# Patient Record
Sex: Female | Born: 1975 | Race: Black or African American | Hispanic: No | Marital: Single | Smoking: Current every day smoker
Health system: Southern US, Community
[De-identification: ages and names within clinical notes are randomized; demographics above are authoritative.]

## PROBLEM LIST (undated history)

## (undated) ENCOUNTER — Inpatient Hospital Stay (HOSPITAL_COMMUNITY): Payer: Self-pay

## (undated) DIAGNOSIS — N926 Irregular menstruation, unspecified: Secondary | ICD-10-CM

## (undated) DIAGNOSIS — T148XXA Other injury of unspecified body region, initial encounter: Secondary | ICD-10-CM

## (undated) DIAGNOSIS — F445 Conversion disorder with seizures or convulsions: Secondary | ICD-10-CM

## (undated) DIAGNOSIS — R569 Unspecified convulsions: Secondary | ICD-10-CM

## (undated) DIAGNOSIS — S42001A Fracture of unspecified part of right clavicle, initial encounter for closed fracture: Secondary | ICD-10-CM

## (undated) DIAGNOSIS — G43909 Migraine, unspecified, not intractable, without status migrainosus: Secondary | ICD-10-CM

## (undated) DIAGNOSIS — T4145XA Adverse effect of unspecified anesthetic, initial encounter: Secondary | ICD-10-CM

## (undated) DIAGNOSIS — Z862 Personal history of diseases of the blood and blood-forming organs and certain disorders involving the immune mechanism: Secondary | ICD-10-CM

## (undated) HISTORY — PX: CLAVICLE SURGERY: SHX598

## (undated) HISTORY — PX: WISDOM TOOTH EXTRACTION: SHX21

---

## 1998-03-11 ENCOUNTER — Inpatient Hospital Stay (HOSPITAL_COMMUNITY): Admission: AD | Admit: 1998-03-11 | Discharge: 1998-03-11 | Payer: Self-pay | Admitting: Obstetrics

## 1998-04-18 ENCOUNTER — Other Ambulatory Visit: Admission: RE | Admit: 1998-04-18 | Discharge: 1998-04-18 | Payer: Self-pay | Admitting: Obstetrics

## 1998-06-19 ENCOUNTER — Emergency Department (HOSPITAL_COMMUNITY): Admission: EM | Admit: 1998-06-19 | Discharge: 1998-06-19 | Payer: Self-pay | Admitting: Family Medicine

## 1998-07-01 ENCOUNTER — Ambulatory Visit (HOSPITAL_COMMUNITY): Admission: RE | Admit: 1998-07-01 | Discharge: 1998-07-01 | Payer: Self-pay | Admitting: Obstetrics

## 1998-07-03 ENCOUNTER — Observation Stay (HOSPITAL_COMMUNITY): Admission: AD | Admit: 1998-07-03 | Discharge: 1998-07-04 | Payer: Self-pay | Admitting: Obstetrics

## 1998-07-07 ENCOUNTER — Inpatient Hospital Stay (HOSPITAL_COMMUNITY): Admission: AD | Admit: 1998-07-07 | Discharge: 1998-07-07 | Payer: Self-pay | Admitting: *Deleted

## 1998-07-09 ENCOUNTER — Ambulatory Visit (HOSPITAL_COMMUNITY): Admission: RE | Admit: 1998-07-09 | Discharge: 1998-07-09 | Payer: Self-pay | Admitting: Obstetrics

## 1998-07-12 ENCOUNTER — Inpatient Hospital Stay (HOSPITAL_COMMUNITY): Admission: AD | Admit: 1998-07-12 | Discharge: 1998-07-12 | Payer: Self-pay | Admitting: Obstetrics

## 1998-07-18 ENCOUNTER — Inpatient Hospital Stay (HOSPITAL_COMMUNITY): Admission: AD | Admit: 1998-07-18 | Discharge: 1998-07-21 | Payer: Self-pay | Admitting: Obstetrics

## 2008-07-21 ENCOUNTER — Emergency Department (HOSPITAL_COMMUNITY): Admission: EM | Admit: 2008-07-21 | Discharge: 2008-07-21 | Payer: Self-pay | Admitting: Emergency Medicine

## 2008-09-06 ENCOUNTER — Inpatient Hospital Stay (HOSPITAL_COMMUNITY): Admission: AD | Admit: 2008-09-06 | Discharge: 2008-09-06 | Payer: Self-pay | Admitting: Obstetrics & Gynecology

## 2008-10-25 ENCOUNTER — Inpatient Hospital Stay (HOSPITAL_COMMUNITY): Admission: AD | Admit: 2008-10-25 | Discharge: 2008-10-26 | Payer: Self-pay | Admitting: Obstetrics & Gynecology

## 2008-11-30 ENCOUNTER — Ambulatory Visit: Payer: Self-pay | Admitting: Physician Assistant

## 2008-11-30 ENCOUNTER — Inpatient Hospital Stay (HOSPITAL_COMMUNITY): Admission: AD | Admit: 2008-11-30 | Discharge: 2008-12-01 | Payer: Self-pay | Admitting: Obstetrics & Gynecology

## 2009-01-28 ENCOUNTER — Ambulatory Visit (HOSPITAL_COMMUNITY): Admission: RE | Admit: 2009-01-28 | Discharge: 2009-01-28 | Payer: Self-pay | Admitting: Family Medicine

## 2009-01-28 ENCOUNTER — Encounter: Payer: Self-pay | Admitting: Family Medicine

## 2009-01-31 ENCOUNTER — Ambulatory Visit: Payer: Self-pay | Admitting: Obstetrics & Gynecology

## 2009-02-14 ENCOUNTER — Ambulatory Visit: Payer: Self-pay | Admitting: Obstetrics & Gynecology

## 2009-02-14 ENCOUNTER — Encounter: Payer: Self-pay | Admitting: Obstetrics & Gynecology

## 2009-02-25 ENCOUNTER — Ambulatory Visit: Payer: Self-pay | Admitting: Obstetrics & Gynecology

## 2009-02-25 LAB — CONVERTED CEMR LAB
HCT: 30.1 % — ABNORMAL LOW (ref 36.0–46.0)
Hemoglobin: 10.2 g/dL — ABNORMAL LOW (ref 12.0–15.0)
MCHC: 33.9 g/dL (ref 30.0–36.0)
MCV: 95.3 fL (ref 78.0–100.0)
Platelets: 240 10*3/uL (ref 150–400)
RDW: 12.7 % (ref 11.5–15.5)

## 2009-03-11 ENCOUNTER — Inpatient Hospital Stay (HOSPITAL_COMMUNITY): Admission: AD | Admit: 2009-03-11 | Discharge: 2009-03-11 | Payer: Self-pay | Admitting: Obstetrics & Gynecology

## 2009-03-11 ENCOUNTER — Ambulatory Visit: Payer: Self-pay | Admitting: Obstetrics & Gynecology

## 2009-03-12 ENCOUNTER — Inpatient Hospital Stay (HOSPITAL_COMMUNITY): Admission: AD | Admit: 2009-03-12 | Discharge: 2009-03-12 | Payer: Self-pay | Admitting: Obstetrics & Gynecology

## 2009-03-18 ENCOUNTER — Ambulatory Visit: Payer: Self-pay | Admitting: Obstetrics & Gynecology

## 2009-03-25 ENCOUNTER — Ambulatory Visit: Payer: Self-pay | Admitting: Obstetrics & Gynecology

## 2009-04-01 ENCOUNTER — Ambulatory Visit: Payer: Self-pay | Admitting: Obstetrics & Gynecology

## 2009-04-15 ENCOUNTER — Encounter: Payer: Self-pay | Admitting: Family

## 2009-04-15 ENCOUNTER — Ambulatory Visit: Payer: Self-pay | Admitting: Obstetrics & Gynecology

## 2009-04-15 LAB — CONVERTED CEMR LAB
Chlamydia, DNA Probe: NEGATIVE
GC Probe Amp, Genital: NEGATIVE

## 2009-04-16 ENCOUNTER — Encounter: Payer: Self-pay | Admitting: Family

## 2009-04-16 LAB — CONVERTED CEMR LAB

## 2009-04-23 ENCOUNTER — Ambulatory Visit: Payer: Self-pay | Admitting: Advanced Practice Midwife

## 2009-04-23 ENCOUNTER — Inpatient Hospital Stay (HOSPITAL_COMMUNITY): Admission: AD | Admit: 2009-04-23 | Discharge: 2009-04-25 | Payer: Self-pay | Admitting: Obstetrics and Gynecology

## 2010-06-22 ENCOUNTER — Inpatient Hospital Stay (HOSPITAL_COMMUNITY): Admission: AD | Admit: 2010-06-22 | Discharge: 2010-06-22 | Payer: Self-pay | Admitting: Obstetrics & Gynecology

## 2010-06-26 ENCOUNTER — Ambulatory Visit: Payer: Self-pay | Admitting: Obstetrics and Gynecology

## 2010-06-26 ENCOUNTER — Inpatient Hospital Stay (HOSPITAL_COMMUNITY): Admission: AD | Admit: 2010-06-26 | Discharge: 2010-06-29 | Payer: Self-pay | Admitting: Obstetrics & Gynecology

## 2010-10-28 LAB — AMNISURE RUPTURE OF MEMBRANE (ROM) NOT AT ARMC: Amnisure ROM: NEGATIVE

## 2010-10-28 LAB — CBC
HCT: 31.2 % — ABNORMAL LOW (ref 36.0–46.0)
MCH: 33.5 pg (ref 26.0–34.0)
MCH: 33.8 pg (ref 26.0–34.0)
MCHC: 34.4 g/dL (ref 30.0–36.0)
MCHC: 34.9 g/dL (ref 30.0–36.0)
MCV: 96.8 fL (ref 78.0–100.0)
MCV: 97.3 fL (ref 78.0–100.0)
Platelets: 193 10*3/uL (ref 150–400)
RBC: 2.9 MIL/uL — ABNORMAL LOW (ref 3.87–5.11)
RDW: 12.8 % (ref 11.5–15.5)
WBC: 11.8 10*3/uL — ABNORMAL HIGH (ref 4.0–10.5)

## 2010-10-28 LAB — RH IMMUNE GLOB WKUP(>/=20WKS)(NOT WOMEN'S HOSP)

## 2010-10-28 LAB — STREP B DNA PROBE

## 2010-11-21 LAB — URINALYSIS, ROUTINE W REFLEX MICROSCOPIC
Ketones, ur: >80 mg/dL — AB
Leukocytes, UA: NEGATIVE
Nitrite: NEGATIVE
Protein, ur: NEGATIVE mg/dL
pH: 6.5 (ref 5.0–8.0)

## 2010-11-21 LAB — CBC
HCT: 33 % — ABNORMAL LOW (ref 36.0–46.0)
MCHC: 35.2 g/dL (ref 30.0–36.0)
MCV: 97.2 fL (ref 78.0–100.0)
Platelets: 168 10*3/uL (ref 150–400)
Platelets: 214 10*3/uL (ref 150–400)
RDW: 12.6 % (ref 11.5–15.5)
RDW: 12.8 % (ref 11.5–15.5)

## 2010-11-21 LAB — RH IMMUNE GLOB WKUP(>/=20WKS)(NOT WOMEN'S HOSP): Fetal Screen: NEGATIVE

## 2010-11-22 LAB — POCT URINALYSIS DIP (DEVICE)
Bilirubin Urine: NEGATIVE
Glucose, UA: NEGATIVE mg/dL
Hgb urine dipstick: NEGATIVE
Ketones, ur: NEGATIVE mg/dL
Ketones, ur: NEGATIVE mg/dL
Nitrite: NEGATIVE
Protein, ur: NEGATIVE mg/dL
Protein, ur: NEGATIVE mg/dL
Protein, ur: NEGATIVE mg/dL
Specific Gravity, Urine: 1.02 (ref 1.005–1.030)
Specific Gravity, Urine: 1.025 (ref 1.005–1.030)
Urobilinogen, UA: 1 mg/dL (ref 0.0–1.0)
pH: 7 (ref 5.0–8.0)
pH: 7 (ref 5.0–8.0)
pH: 7 (ref 5.0–8.0)

## 2010-11-23 LAB — POCT URINALYSIS DIP (DEVICE)
Glucose, UA: NEGATIVE mg/dL
Nitrite: NEGATIVE
Nitrite: NEGATIVE
Protein, ur: NEGATIVE mg/dL
Protein, ur: NEGATIVE mg/dL
Protein, ur: NEGATIVE mg/dL
Specific Gravity, Urine: 1.01 (ref 1.005–1.030)
Urobilinogen, UA: 0.2 mg/dL (ref 0.0–1.0)
Urobilinogen, UA: 0.2 mg/dL (ref 0.0–1.0)
pH: 6 (ref 5.0–8.0)
pH: 6 (ref 5.0–8.0)
pH: 7 (ref 5.0–8.0)

## 2010-11-24 LAB — POCT URINALYSIS DIP (DEVICE)
Protein, ur: NEGATIVE mg/dL
Urobilinogen, UA: 0.2 mg/dL (ref 0.0–1.0)
pH: 6 (ref 5.0–8.0)

## 2010-11-27 LAB — URINALYSIS, ROUTINE W REFLEX MICROSCOPIC
Bilirubin Urine: NEGATIVE
Glucose, UA: NEGATIVE mg/dL
Ketones, ur: NEGATIVE mg/dL
Leukocytes, UA: NEGATIVE
Nitrite: NEGATIVE
Protein, ur: NEGATIVE mg/dL

## 2010-11-27 LAB — URINE MICROSCOPIC-ADD ON

## 2010-12-01 LAB — DIFFERENTIAL
Basophils Absolute: 0 10*3/uL (ref 0.0–0.1)
Basophils Relative: 0 % (ref 0–1)
Eosinophils Relative: 1 % (ref 0–5)
Monocytes Absolute: 0.7 10*3/uL (ref 0.1–1.0)
Neutro Abs: 7 10*3/uL (ref 1.7–7.7)

## 2010-12-01 LAB — URINALYSIS, ROUTINE W REFLEX MICROSCOPIC
Bilirubin Urine: NEGATIVE
Glucose, UA: NEGATIVE mg/dL
Nitrite: NEGATIVE
Specific Gravity, Urine: 1.025 (ref 1.005–1.030)
pH: 6 (ref 5.0–8.0)

## 2010-12-01 LAB — URINE MICROSCOPIC-ADD ON

## 2010-12-01 LAB — GC/CHLAMYDIA PROBE AMP, GENITAL: Chlamydia, DNA Probe: NEGATIVE

## 2010-12-01 LAB — POCT PREGNANCY, URINE: Preg Test, Ur: POSITIVE

## 2010-12-01 LAB — CBC
Hemoglobin: 11.1 g/dL — ABNORMAL LOW (ref 12.0–15.0)
MCHC: 34.2 g/dL (ref 30.0–36.0)
RDW: 11.8 % (ref 11.5–15.5)

## 2010-12-01 LAB — WET PREP, GENITAL

## 2011-05-22 LAB — POCT PREGNANCY, URINE: Preg Test, Ur: NEGATIVE

## 2011-11-05 ENCOUNTER — Encounter (HOSPITAL_COMMUNITY): Payer: Self-pay | Admitting: Anesthesiology

## 2011-11-05 ENCOUNTER — Emergency Department (HOSPITAL_COMMUNITY): Payer: Medicaid Other

## 2011-11-05 ENCOUNTER — Encounter (HOSPITAL_COMMUNITY)
Admission: EM | Disposition: A | Discharge status: Home / Self Care | Payer: Self-pay | Source: Home / Self Care | Attending: Obstetrics & Gynecology

## 2011-11-05 ENCOUNTER — Inpatient Hospital Stay (HOSPITAL_COMMUNITY): Payer: Medicaid Other | Admitting: Anesthesiology

## 2011-11-05 ENCOUNTER — Encounter (HOSPITAL_COMMUNITY): Payer: Self-pay | Admitting: Emergency Medicine

## 2011-11-05 ENCOUNTER — Inpatient Hospital Stay: Admit: 2011-11-05 | Payer: Self-pay | Admitting: Obstetrics & Gynecology

## 2011-11-05 ENCOUNTER — Ambulatory Visit (HOSPITAL_COMMUNITY)
Admission: EM | Admit: 2011-11-05 | Discharge: 2011-11-05 | Disposition: A | Discharge status: Home / Self Care | Payer: Medicaid Other | Attending: Obstetrics & Gynecology | Admitting: Obstetrics & Gynecology

## 2011-11-05 DIAGNOSIS — K661 Hemoperitoneum: Secondary | ICD-10-CM | POA: Insufficient documentation

## 2011-11-05 DIAGNOSIS — Z3201 Encounter for pregnancy test, result positive: Secondary | ICD-10-CM

## 2011-11-05 DIAGNOSIS — O00109 Unspecified tubal pregnancy without intrauterine pregnancy: Principal | ICD-10-CM | POA: Insufficient documentation

## 2011-11-05 DIAGNOSIS — R1031 Right lower quadrant pain: Secondary | ICD-10-CM | POA: Insufficient documentation

## 2011-11-05 DIAGNOSIS — O009 Unspecified ectopic pregnancy without intrauterine pregnancy: Secondary | ICD-10-CM | POA: Diagnosis present

## 2011-11-05 HISTORY — PX: LAPAROSCOPY: SHX197

## 2011-11-05 HISTORY — DX: Unspecified convulsions: R56.9

## 2011-11-05 LAB — COMPREHENSIVE METABOLIC PANEL
ALT: 13 U/L (ref 0–35)
Albumin: 3.1 g/dL — ABNORMAL LOW (ref 3.5–5.2)
Alkaline Phosphatase: 39 U/L (ref 39–117)
BUN: 11 mg/dL (ref 6–23)
Chloride: 107 mEq/L (ref 96–112)
GFR calc Af Amer: >90 mL/min (ref >90)
Glucose, Bld: 110 mg/dL — ABNORMAL HIGH (ref 70–99)
Potassium: 4 mEq/L (ref 3.5–5.1)
Total Bilirubin: 0.3 mg/dL (ref 0.3–1.2)

## 2011-11-05 LAB — URINALYSIS, ROUTINE W REFLEX MICROSCOPIC
Hgb urine dipstick: NEGATIVE
Ketones, ur: 15 mg/dL — AB
Nitrite: NEGATIVE
pH: 6 (ref 5.0–8.0)

## 2011-11-05 LAB — DIFFERENTIAL
Lymphocytes Relative: 10 % — ABNORMAL LOW (ref 12–46)
Lymphs Abs: 1.6 10*3/uL (ref 0.7–4.0)
Monocytes Relative: 4 % (ref 3–12)
Neutro Abs: 13.5 10*3/uL — ABNORMAL HIGH (ref 1.7–7.7)
Neutrophils Relative %: 86 % — ABNORMAL HIGH (ref 43–77)

## 2011-11-05 LAB — LIPASE, BLOOD: Lipase: 22 U/L (ref 11–59)

## 2011-11-05 LAB — CBC
Hemoglobin: 10.5 g/dL — ABNORMAL LOW (ref 12.0–15.0)
RBC: 3.37 MIL/uL — ABNORMAL LOW (ref 3.87–5.11)
WBC: 15.7 10*3/uL — ABNORMAL HIGH (ref 4.0–10.5)

## 2011-11-05 SURGERY — LAPAROSCOPY OPERATIVE
Anesthesia: General | Site: Abdomen | Wound class: Clean Contaminated

## 2011-11-05 MED ORDER — SODIUM CHLORIDE 0.9 % IV BOLUS (SEPSIS)
1000.0000 mL | Freq: Once | INTRAVENOUS | Status: AC
  Administered 2011-11-05: 1000 mL via INTRAVENOUS

## 2011-11-05 MED ORDER — ROCURONIUM BROMIDE 100 MG/10ML IV SOLN
INTRAVENOUS | Status: DC | PRN
  Administered 2011-11-05: 20 mg via INTRAVENOUS
  Administered 2011-11-05: 5 mg via INTRAVENOUS

## 2011-11-05 MED ORDER — SODIUM CHLORIDE 0.9 % IV SOLN
Freq: Once | INTRAVENOUS | Status: AC
  Administered 2011-11-05: 999 mL via INTRAVENOUS

## 2011-11-05 MED ORDER — HYDROMORPHONE HCL PF 1 MG/ML IJ SOLN
1.0000 mg | Freq: Once | INTRAMUSCULAR | Status: AC
  Administered 2011-11-05: 1 mg via INTRAVENOUS
  Filled 2011-11-05: qty 1

## 2011-11-05 MED ORDER — BUPIVACAINE HCL (PF) 0.25 % IJ SOLN
INTRAMUSCULAR | Status: DC | PRN
  Administered 2011-11-05: 8 mL

## 2011-11-05 MED ORDER — DEXAMETHASONE SODIUM PHOSPHATE 10 MG/ML IJ SOLN
INTRAMUSCULAR | Status: DC | PRN
  Administered 2011-11-05: 10 mg via INTRAVENOUS

## 2011-11-05 MED ORDER — HYDROMORPHONE HCL PF 1 MG/ML IJ SOLN: 0.2500 mg | INTRAMUSCULAR | Status: DC | PRN

## 2011-11-05 MED ORDER — LACTATED RINGERS IV SOLN
INTRAVENOUS | Status: DC | PRN
  Administered 2011-11-05 (×2): via INTRAVENOUS

## 2011-11-05 MED ORDER — MIDAZOLAM HCL 5 MG/5ML IJ SOLN
INTRAMUSCULAR | Status: DC | PRN
  Administered 2011-11-05: 1 mg via INTRAVENOUS

## 2011-11-05 MED ORDER — KETOROLAC TROMETHAMINE 30 MG/ML IJ SOLN
INTRAMUSCULAR | Status: DC | PRN
  Administered 2011-11-05: 30 mg via INTRAVENOUS

## 2011-11-05 MED ORDER — HYDROCODONE-ACETAMINOPHEN 5-500 MG PO TABS: 2.0000 | ORAL_TABLET | Freq: Four times a day (QID) | ORAL | Status: DC | PRN

## 2011-11-05 MED ORDER — LACTATED RINGERS IV SOLN
INTRAVENOUS | Status: DC | PRN
  Administered 2011-11-05: 18:00:00 via INTRAVENOUS

## 2011-11-05 MED ORDER — LACTATED RINGERS IR SOLN
Status: DC | PRN
  Administered 2011-11-05: 3000 mL

## 2011-11-05 MED ORDER — PROPOFOL 10 MG/ML IV EMUL
INTRAVENOUS | Status: DC | PRN
  Administered 2011-11-05: 150 mg via INTRAVENOUS

## 2011-11-05 MED ORDER — CITRIC ACID-SODIUM CITRATE 334-500 MG/5ML PO SOLN
30.0000 mL | Freq: Once | ORAL | Status: AC
  Administered 2011-11-05: 30 mL via ORAL
  Filled 2011-11-05: qty 15

## 2011-11-05 MED ORDER — CEFAZOLIN SODIUM 1-5 GM-% IV SOLN
INTRAVENOUS | Status: DC | PRN
  Administered 2011-11-05: 1 g via INTRAVENOUS

## 2011-11-05 MED ORDER — CITRIC ACID-SODIUM CITRATE 334-500 MG/5ML PO SOLN
ORAL | Status: AC
  Administered 2011-11-05: 30 mL via ORAL
  Filled 2011-11-05: qty 15

## 2011-11-05 MED ORDER — FAMOTIDINE IN NACL 20-0.9 MG/50ML-% IV SOLN
INTRAVENOUS | Status: AC
  Administered 2011-11-05: 20 mg
  Filled 2011-11-05: qty 50

## 2011-11-05 MED ORDER — EPHEDRINE SULFATE 50 MG/ML IJ SOLN
INTRAMUSCULAR | Status: DC | PRN
  Administered 2011-11-05: 5 mg via INTRAVENOUS

## 2011-11-05 MED ORDER — MEPERIDINE HCL 25 MG/ML IJ SOLN: 6.2500 mg | INTRAMUSCULAR | Status: DC | PRN

## 2011-11-05 MED ORDER — SUCCINYLCHOLINE CHLORIDE 20 MG/ML IJ SOLN
INTRAMUSCULAR | Status: DC | PRN
  Administered 2011-11-05: 100 mg via INTRAVENOUS

## 2011-11-05 MED ORDER — ONDANSETRON HCL 4 MG/2ML IJ SOLN
4.0000 mg | Freq: Once | INTRAMUSCULAR | Status: AC
  Administered 2011-11-05: 4 mg via INTRAVENOUS
  Filled 2011-11-05: qty 2

## 2011-11-05 MED ORDER — NEOSTIGMINE METHYLSULFATE 1 MG/ML IJ SOLN
INTRAMUSCULAR | Status: DC | PRN
  Administered 2011-11-05: 3 mg via INTRAVENOUS

## 2011-11-05 MED ORDER — GLYCOPYRROLATE 0.2 MG/ML IJ SOLN
INTRAMUSCULAR | Status: DC | PRN
  Administered 2011-11-05: 0.6 mg via INTRAVENOUS

## 2011-11-05 MED ORDER — LIDOCAINE HCL (CARDIAC) 20 MG/ML IV SOLN
INTRAVENOUS | Status: DC | PRN
  Administered 2011-11-05: 60 mg via INTRAVENOUS

## 2011-11-05 MED ORDER — METOCLOPRAMIDE HCL 5 MG/ML IJ SOLN: 10.0000 mg | Freq: Once | INTRAMUSCULAR | Status: DC | PRN

## 2011-11-05 MED ORDER — IBUPROFEN 200 MG PO TABS: 800.0000 mg | ORAL_TABLET | Freq: Four times a day (QID) | ORAL | Status: DC | PRN

## 2011-11-05 MED ORDER — FAMOTIDINE IN NACL 20-0.9 MG/50ML-% IV SOLN
20.0000 mg | Freq: Once | INTRAVENOUS | Status: DC
  Filled 2011-11-05: qty 50

## 2011-11-05 MED ORDER — FENTANYL CITRATE 0.05 MG/ML IJ SOLN
INTRAMUSCULAR | Status: DC | PRN
  Administered 2011-11-05: 50 ug via INTRAVENOUS
  Administered 2011-11-05: 100 ug via INTRAVENOUS
  Administered 2011-11-05 (×2): 50 ug via INTRAVENOUS
  Administered 2011-11-05: 100 ug via INTRAVENOUS

## 2011-11-05 MED ORDER — ONDANSETRON HCL 4 MG/2ML IJ SOLN
INTRAMUSCULAR | Status: DC | PRN
  Administered 2011-11-05: 4 mg via INTRAVENOUS

## 2011-11-05 MED ORDER — RHO D IMMUNE GLOBULIN 1500 UNIT/2ML IJ SOLN
300.0000 ug | Freq: Once | INTRAMUSCULAR | Status: AC
  Administered 2011-11-05: 300 ug via INTRAMUSCULAR

## 2011-11-05 SURGICAL SUPPLY — 32 items
BENZOIN TINCTURE PRP APPL 2/3 (GAUZE/BANDAGES/DRESSINGS) IMPLANT
CABLE HIGH FREQUENCY MONO STRZ (ELECTRODE) IMPLANT
CATH ROBINSON RED A/P 16FR (CATHETERS) IMPLANT
CHLORAPREP W/TINT 26ML (MISCELLANEOUS) ×2 IMPLANT
CLOTH BEACON ORANGE TIMEOUT ST (SAFETY) ×2 IMPLANT
DERMABOND ADVANCED (GAUZE/BANDAGES/DRESSINGS)
DERMABOND ADVANCED .7 DNX12 (GAUZE/BANDAGES/DRESSINGS) IMPLANT
GLOVE BIOGEL PI IND STRL 7.0 (GLOVE) ×3 IMPLANT
GLOVE BIOGEL PI INDICATOR 7.0 (GLOVE) ×3
GLOVE ECLIPSE 6.5 STRL STRAW (GLOVE) ×4 IMPLANT
GOWN PREVENTION PLUS LG XLONG (DISPOSABLE) ×2 IMPLANT
GOWN PREVENTION PLUS XLARGE (GOWN DISPOSABLE) ×2 IMPLANT
NEEDLE INSUFFLATION 14GA 120MM (NEEDLE) ×2 IMPLANT
PACK LAPAROSCOPY BASIN (CUSTOM PROCEDURE TRAY) ×2 IMPLANT
POUCH SPECIMEN RETRIEVAL 10MM (ENDOMECHANICALS) ×2 IMPLANT
PROTECTOR NERVE ULNAR (MISCELLANEOUS) ×2 IMPLANT
SEALER TISSUE G2 CVD JAW 35 (ENDOMECHANICALS) ×1 IMPLANT
SEALER TISSUE G2 CVD JAW 45CM (ENDOMECHANICALS) ×1
SET IRRIG TUBING LAPAROSCOPIC (IRRIGATION / IRRIGATOR) ×2 IMPLANT
STRIP CLOSURE SKIN 1/2X4 (GAUZE/BANDAGES/DRESSINGS) IMPLANT
STRIP CLOSURE SKIN 1/4X4 (GAUZE/BANDAGES/DRESSINGS) ×2 IMPLANT
SUT VICRYL 0 UR6 27IN ABS (SUTURE) ×4 IMPLANT
SUT VICRYL 4-0 PS2 18IN ABS (SUTURE) ×2 IMPLANT
SYR 30ML LL (SYRINGE) IMPLANT
TOWEL OR 17X24 6PK STRL BLUE (TOWEL DISPOSABLE) ×4 IMPLANT
TRAY FOLEY CATH 14FR (SET/KITS/TRAYS/PACK) ×2 IMPLANT
TROCAR BALLN 12MMX100 BLUNT (TROCAR) IMPLANT
TROCAR XCEL 12X100 BLDLESS (ENDOMECHANICALS) ×2 IMPLANT
TROCAR XCEL NON-BLD 11X100MML (ENDOMECHANICALS) ×2 IMPLANT
TROCAR XCEL NON-BLD 5MMX100MML (ENDOMECHANICALS) ×2 IMPLANT
WARMER LAPAROSCOPE (MISCELLANEOUS) ×2 IMPLANT
WATER STERILE IRR 1000ML POUR (IV SOLUTION) IMPLANT

## 2011-11-05 NOTE — Consult Note (Signed)
Reason for Consult:early pregnancy, abdominal pain Referring Physician:ER  Katie Malone is an 36 y.o. female  HPI: 36 yo G8P6A1 WF with one day history of severe, sharp, right lower and right upper quadrant pain.  Patient reported she delivered her last child in December 2012.  She has sex before her post partum visit and she has not had a really normal cycle.  No vaginal bleeding.  Pain started in the right lower side and worsened to feel like it was also under her right rib.  She denies nausea, vomiting, constipation.  No fever or chills.   Past Medical History  Diagnosis Date  . Seizures     History reviewed. No pertinent past surgical history.  History reviewed. No pertinent family history.  Social History:  does not have a smoking history on file. She does not have any smokeless tobacco history on file. Her alcohol and drug histories not on file.  Allergies: No Known Allergies  Medications: I have reviewed the patient's current medications.  Results for orders placed during the hospital encounter of 11/05/11 (from the past 48 hour(s))  CBC     Status: Abnormal   Collection Time   11/05/11 11:19 AM      Component Value Range Comment   WBC 15.7 (*) 4.0 - 10.5 (K/uL)    RBC 3.37 (*) 3.87 - 5.11 (MIL/uL)    Hemoglobin 10.5 (*) 12.0 - 15.0 (g/dL)    HCT 56.2 (*) 13.0 - 46.0 (%)    MCV 90.8  78.0 - 100.0 (fL)    MCH 31.2  26.0 - 34.0 (pg)    MCHC 34.3  30.0 - 36.0 (g/dL)    RDW 86.5  78.4 - 69.6 (%)    Platelets 284  150 - 400 (K/uL)   DIFFERENTIAL     Status: Abnormal   Collection Time   11/05/11 11:19 AM      Component Value Range Comment   Neutrophils Relative 86 (*) 43 - 77 (%)    Neutro Abs 13.5 (*) 1.7 - 7.7 (K/uL)    Lymphocytes Relative 10 (*) 12 - 46 (%)    Lymphs Abs 1.6  0.7 - 4.0 (K/uL)    Monocytes Relative 4  3 - 12 (%)    Monocytes Absolute 0.6  0.1 - 1.0 (K/uL)    Eosinophils Relative 0  0 - 5 (%)    Eosinophils Absolute 0.0  0.0 - 0.7 (K/uL)    Basophils Relative 0  0 - 1 (%)    Basophils Absolute 0.0  0.0 - 0.1 (K/uL)   COMPREHENSIVE METABOLIC PANEL     Status: Abnormal   Collection Time   11/05/11 11:19 AM      Component Value Range Comment   Sodium 139  135 - 145 (mEq/L)    Potassium 4.0  3.5 - 5.1 (mEq/L)    Chloride 107  96 - 112 (mEq/L)    CO2 26  19 - 32 (mEq/L)    Glucose, Bld 110 (*) 70 - 99 (mg/dL)    BUN 11  6 - 23 (mg/dL)    Creatinine, Ser 2.95  0.50 - 1.10 (mg/dL)    Calcium 8.5  8.4 - 10.5 (mg/dL)    Total Protein 5.8 (*) 6.0 - 8.3 (g/dL)    Albumin 3.1 (*) 3.5 - 5.2 (g/dL)    AST 13  0 - 37 (U/L)    ALT 13  0 - 35 (U/L)    Alkaline Phosphatase 39  39 -  117 (U/L)    Total Bilirubin 0.3  0.3 - 1.2 (mg/dL)    GFR calc non Af Amer >90  >90 (mL/min)    GFR calc Af Amer >90  >90 (mL/min)   LIPASE, BLOOD     Status: Normal   Collection Time   11/05/11 11:19 AM      Component Value Range Comment   Lipase 22  11 - 59 (U/L)   HCG, QUANTITATIVE, PREGNANCY     Status: Abnormal   Collection Time   11/05/11 11:19 AM      Component Value Range Comment   hCG, Beta Chain, Quant, S 4116 (*) <5 (mIU/mL)   URINALYSIS, ROUTINE W REFLEX MICROSCOPIC     Status: Abnormal   Collection Time   11/05/11 12:00 PM      Component Value Range Comment   Color, Urine YELLOW  YELLOW     APPearance CLEAR  CLEAR     Specific Gravity, Urine 1.029  1.005 - 1.030     pH 6.0  5.0 - 8.0     Glucose, UA NEGATIVE  NEGATIVE (mg/dL)    Hgb urine dipstick NEGATIVE  NEGATIVE     Bilirubin Urine SMALL (*) NEGATIVE     Ketones, ur 15 (*) NEGATIVE (mg/dL)    Protein, ur NEGATIVE  NEGATIVE (mg/dL)    Urobilinogen, UA 1.0  0.0 - 1.0 (mg/dL)    Nitrite NEGATIVE  NEGATIVE     Leukocytes, UA NEGATIVE  NEGATIVE  MICROSCOPIC NOT DONE ON URINES WITH NEGATIVE PROTEIN, BLOOD, LEUKOCYTES, NITRITE, OR GLUCOSE <1000 mg/dL.  PREGNANCY, URINE     Status: Abnormal   Collection Time   11/05/11 12:00 PM      Component Value Range Comment   Preg Test, Ur POSITIVE  (*) NEGATIVE      Ct Head Wo Contrast  11/05/2011  *RADIOLOGY REPORT*  Clinical Data: Seizure, abdominal pain, nausea, vomiting, pregnant  CT HEAD WITHOUT CONTRAST  Technique:  Contiguous axial images were obtained from the base of the skull through the vertex without contrast.  Comparison: None  Findings: Normal ventricular morphology. No midline shift or mass effect. Normal appearance of brain parenchyma. No intracranial hemorrhage, mass lesion, or acute infarction. Visualized paranasal sinuses and mastoid air cells clear. Bones unremarkable.  IMPRESSION: No acute intracranial abnormalities.  Original Report Authenticated By: Lollie Marrow, M.D.   US Ob Comp Less 14 Wks  11/05/2011  *RADIOLOGY REPORT*  Clinical Data: First trimester pregnancy with pelvic pain; evaluate for ectopic pregnancy.  Serum beta HCG level 4116.  OBSTETRIC <14 WK [place] AND TRANSVAGINAL OB US  Technique:  Both transabdominal and transvaginal ultrasound examinations were performed for complete evaluation of the gestation as well as the maternal uterus, adnexal regions, and pelvic cul-de-sac.  Transvaginal technique was performed to assess early pregnancy.  Comparison:  None relevant.  Intrauterine gestational sac:  None visualized. Yolk sac: None visualized. Embryo: None visualized.  Maternal uterus/adnexae: There is heterogeneous thickening of the endometrium to 2.0 cm.  No intrauterine gestational sac is identified.  The right ovary measures 3.5 x 1.9 x 2.3 cm.  The left ovary is not visualized. There is a complex structure adjacent to the uterine fundus and right ovary which measures 3.1 x 2.6 x 2.9 cm.  This has an echogenic rim, but no significant hypervascularity on color Doppler.  There is a moderate amount of free pelvic fluid.  Some ascites is also seen adjacent to the liver and in both pericolic gutters.  IMPRESSION:  1.  No intrauterine gestation is identified. 2.  Complex adnexal mass superior to the uterus, moderate pelvic  fluid and ascites concerning for possible ectopic pregnancy.  Critical Value/emergent results were called by telephone at the time of interpretation on 11/05/2011  at 1610 hours  to  Dr. Estell Harpin, who verbally acknowledged these results.  Original Report Authenticated By: Gerrianne Scale, M.D.   US Ob Transvaginal  11/05/2011  *RADIOLOGY REPORT*  Clinical Data: First trimester pregnancy with pelvic pain; evaluate for ectopic pregnancy.  Serum beta HCG level 4116.  OBSTETRIC <14 WK [place] AND TRANSVAGINAL OB US  Technique:  Both transabdominal and transvaginal ultrasound examinations were performed for complete evaluation of the gestation as well as the maternal uterus, adnexal regions, and pelvic cul-de-sac.  Transvaginal technique was performed to assess early pregnancy.  Comparison:  None relevant.  Intrauterine gestational sac:  None visualized. Yolk sac: None visualized. Embryo: None visualized.  Maternal uterus/adnexae: There is heterogeneous thickening of the endometrium to 2.0 cm.  No intrauterine gestational sac is identified.  The right ovary measures 3.5 x 1.9 x 2.3 cm.  The left ovary is not visualized. There is a complex structure adjacent to the uterine fundus and right ovary which measures 3.1 x 2.6 x 2.9 cm.  This has an echogenic rim, but no significant hypervascularity on color Doppler.  There is a moderate amount of free pelvic fluid.  Some ascites is also seen adjacent to the liver and in both pericolic gutters.  IMPRESSION:  1.  No intrauterine gestation is identified. 2.  Complex adnexal mass superior to the uterus, moderate pelvic fluid and ascites concerning for possible ectopic pregnancy.  Critical Value/emergent results were called by telephone at the time of interpretation on 11/05/2011  at 1610 hours  to  Dr. Estell Harpin, who verbally acknowledged these results.  Original Report Authenticated By: Gerrianne Scale, M.D.    Review of Systems  Constitutional: Negative for fever and chills.    Eyes: Negative for blurred vision and double vision.  Respiratory: Negative for cough.   Cardiovascular: Negative for chest pain and palpitations.  Gastrointestinal: Positive for abdominal pain. Negative for heartburn and nausea.  Genitourinary: Negative for dysuria.  Musculoskeletal: Negative for myalgias.  Skin: Negative for rash.  Neurological: Negative for dizziness, tingling and headaches.  Endo/Heme/Allergies: Does not bruise/bleed easily.  Psychiatric/Behavioral: Negative for depression and suicidal ideas.   Blood pressure 115/72, pulse 58, temperature 97.5 F (36.4 C), temperature source Oral, resp. rate 18, height 5\' 2"  (1.575 m), weight 64.864 kg (143 lb), SpO2 97.00%. Physical Exam  Vitals reviewed. Constitutional: She is oriented to person, place, and time. She appears well-developed and well-nourished.  HENT:  Head: Normocephalic and atraumatic.  Neck: Normal range of motion. Neck supple.  Cardiovascular: Normal rate, regular rhythm and normal heart sounds.   Respiratory: Effort normal and breath sounds normal.  GI: She exhibits distension. There is tenderness. There is rebound and guarding.  Musculoskeletal: Normal range of motion.  Neurological: She is alert and oriented to person, place, and time.  Skin: Skin is warm and dry.  Psychiatric: She has a normal mood and affect.    Assessment/Plan: 36 year old G31P6A1 SWF with ruptured ectopic pregnancy on right.  Laparoscopic salpingectomy has been recommended.  Risks and benefits reviewed.  She is not a candidate for methotrexate.  All questions answered.  Patient stable and ready to proceed.  OR aware.  Razi Hickle,M SUZANNE 11/05/2011, 5:53 PM

## 2011-11-05 NOTE — Discharge Instructions (Addendum)
Post-surgical Instructions, Outpatient Surgery  You may expect to feel dizzy, weak, and drowsy for as long as 24 hours after receiving the medicine that made you sleep (anesthetic). For the first 24 hours after your surgery:    Do not drive a car, ride a bicycle, participate in physical activities, or take public transportation until you are done taking narcotic pain medicines.  Do not drink alcohol or take tranquilizers.   Do not take medicine that has not been prescribed by your physicians.   Do not sign important papers or make important decisions while on narcotic pain medicines.   Have a responsible person with you.   CARE OF INCISION  If you have a bandage, you may remove it in one day.   You dermabond on your incision.  This is like superglue.  Don't pick this off until it is at least one week old.  You may shower on the first day after your surgery.  Do not sit in a tub bath for one week.  Avoid heavy lifting (more than 10 pounds/4.5 kilograms), pushing, or pulling.   Avoid activities that may risk injury to your incisions.   PAIN MANAGEMENT  Motrin 800mg .  (This is the same as 4-200mg  over the counter tablets of Motrin or ibuprofen.)  You may take this every eight hours or as needed for cramping.    Vicodin 5/500mg .  For more severe pain, take one or two tablets every four to six hours as needed for pain control.  (Remember that narcotic pain medications increase your risk of constipation.  If this becomes a problem, you may take an over the counter stool softener like Colace 100mg  up to four times a day.)  DO'S AND DON'T'S  Do not take a tub bath for one week.  You may shower on the first day after your surgery  Do not do any heavy lifting for one to two weeks.  This increases the chance of bleeding.  Do move around as you feel able.  Stairs are fine.  You may begin to exercise again as you feel able.  Do not lift any weights for two weeks.  Do not put anything in  the vagina for two weeks--no tampons, intercourse, or douching.    REGULAR MEDIATIONS/VITAMINS:  You may restart all of your regular medications as prescribed.  You may restart all of your vitamins as you normally take them.    It is fine to start back your birth control pill tomorrow--even if you are bleeding.  PLEASE CALL OR SEEK MEDICAL CARE IF:  You have persistent nausea and vomiting.   You have trouble eating or drinking.   You have an oral temperature above 100.5.   You have constipation that is not helped by adjusting diet or increasing fluid intake. Pain medicines are a common cause of constipation.   You have heavy vaginal bleeding  You have redness or drainage from your incision(s) or there is increasing pain or tenderness near or in the surgical site.

## 2011-11-05 NOTE — MAU Note (Signed)
Pt received from [facility] with ectopic pregnancy, Dr. Hyacinth Meeker @ bedside, discussing surgery with pt.

## 2011-11-05 NOTE — Anesthesia Procedure Notes (Signed)
Procedure Name: Intubation Date/Time: 11/05/2011 6:22 PM Performed by: Graciela Husbands Pre-anesthesia Checklist: Suction available, Emergency Drugs available, Timeout performed, Patient identified and Patient being monitored Patient Re-evaluated:Patient Re-evaluated prior to inductionOxygen Delivery Method: Circle system utilized Preoxygenation: Pre-oxygenation with 100% oxygen Intubation Type: IV induction, Cricoid Pressure applied and Rapid sequence Laryngoscope Size: Mac and 3 Grade View: Grade I Tube type: Oral Tube size: 7.0 mm Number of attempts: 1 Airway Equipment and Method: Stylet Placement Confirmation: ETT inserted through vocal cords under direct vision,  breath sounds checked- equal and bilateral and positive ETCO2 Secured at: 21 cm Tube secured with: Tape Dental Injury: Teeth and Oropharynx as per pre-operative assessment

## 2011-11-05 NOTE — Transfer of Care (Signed)
Immediate Anesthesia Transfer of Care Note  Patient: Katie Malone  Procedure(s) Performed: Procedure(s) (LRB): LAPAROSCOPY OPERATIVE (N/A)  Patient Location: PACU  Anesthesia Type: General  Level of Consciousness: awake, alert  and oriented  Airway & Oxygen Therapy: Patient Spontanous Breathing and Patient connected to nasal cannula oxygen  Post-op Assessment: Report given to PACU RN and Post -op Vital signs reviewed and stable  Post vital signs: Reviewed and stable  Complications: No apparent anesthesia complications

## 2011-11-05 NOTE — ED Notes (Signed)
Pt came from home for severe abdominal pain and nausea.  Pt was in triage and felt hot and dizzy with blurry vision then pt began seizing.  Pt was sitting in chair.  Pt did not hit head or fall.  Pt was unresponsive for 10 sec.  Pt now alert oriented X4   Seizure precautions and pads inplace

## 2011-11-05 NOTE — ED Notes (Signed)
Pt states she is going to pass out and has sz in chair. Pt has generalised shaking and became unresponsive to verbal stimuli lasting ~ 15 sec (states had sz once before states this right before she has one)

## 2011-11-05 NOTE — H&P (Signed)
Katie Malone is an 36 y.o. female G8P6TAB1 with unsure LMP who presented to [facility] ER with about 24 hours of worsening RLQ pain.  Patient reports pain initially started in the right lower quadrant and was a dull ache but worsened through the night and into this morning.  She is now having pain up under the right rib and has worse pain with deep inspiration.  She denies vaginal bleeding.  Patient has a normal vaginal delivery in December and reports being sexually active within a month of her delivery.  She did start OCP's but this wasn't until in January sometime.  She admits she has risk for pregnancy.  At this time the pain is a 7/10.  She has received Dilaudid in the ER.  Pain is constant, sharp, worse with movement, with legs extended and with deep inspiration.  She had a positive pregnancy test in the ER, HCG >4000, and U/S showed a 3.5cm right adnexal mass with free fluid in the pelvis.  Patient had a seizure in the ER while evaluation was taking place and a CT of the head was performed which was normal.  Patient denies headache, nausea, vaginal bleeding, vaginal discharge, back or shoulder pain.  Pertinent Gynecological History: Menses: no bleeding since january Bleeding: as above Contraception: patient is on [place]ale but started it after first episode of intercourse since delivery DES exposure: denies Blood transfusions: none Sexually transmitted diseases: past history: chlamydia Previous GYN Procedures: NSVD x 6  Last mammogram: N/A Date: N/A Last pap: abnormal: LGSIL Date: 10/10 per chart OB History: G8, P6   Menstrual History: Menarche age: 29 No LMP recorded. Patient is pregnant.    Past Medical History  Diagnosis Date  . Seizures     Past Surgical History  Procedure Date  . Wisdom tooth extraction     History reviewed. No pertinent family history.  Social History:  reports that she has been smoking Cigarettes.  She has been smoking about .5 packs per day. She does  not have any smokeless tobacco history on file. She reports that she does not drink alcohol or use illicit drugs.  Allergies: No Known Allergies  Prescriptions prior to admission  Medication Sig Dispense Refill  . levonorgestrel-ethinyl estradiol (SEASONALE) 0.15-0.03 MG tablet Take 1 tablet by mouth daily.        Review of Systems  Constitutional: Negative for fever and chills.  HENT: Negative for neck pain.   Eyes: Negative for blurred vision and double vision.  Respiratory: Negative for cough and shortness of breath.   Cardiovascular: Negative for chest pain and palpitations.  Gastrointestinal: Positive for abdominal pain. Negative for heartburn, nausea, vomiting, diarrhea and constipation.  Genitourinary: Negative for dysuria, urgency, frequency and hematuria.  Musculoskeletal: Negative for myalgias and back pain.  Skin: Negative for itching and rash.  Neurological: Negative for dizziness, tingling and headaches.  Endo/Heme/Allergies: Does not bruise/bleed easily.  Psychiatric/Behavioral: Negative for depression.    Blood pressure 115/72, pulse 58, temperature 97.5 F (36.4 C), temperature source Oral, resp. rate 18, height 5\' 2"  (1.575 m), weight 64.864 kg (143 lb), SpO2 97.00%. Physical Exam  Constitutional: She is oriented to person, place, and time. She appears well-developed and well-nourished.  HENT:  Head: Normocephalic and atraumatic.  Neck: Normal range of motion. Neck supple.  Cardiovascular: Normal rate, regular rhythm and normal heart sounds.   Respiratory: Effort normal and breath sounds normal. No respiratory distress. She has no wheezes.  GI: She exhibits distension. There is tenderness.  There is rebound and guarding.       Exquisite right lower quadrant tenderness.  No HSM/hernias/masses.  Genitourinary: Vagina normal and uterus normal.       Right adnexal tenderness and +CMT  Musculoskeletal: Normal range of motion. She exhibits edema.  Neurological: She is  alert and oriented to person, place, and time.  Skin: Skin is warm and dry.  Psychiatric: She has a normal mood and affect.    Results for orders placed during the hospital encounter of 11/05/11 (from the past 24 hour(s))  CBC     Status: Abnormal   Collection Time   11/05/11 11:19 AM      Component Value Range   WBC 15.7 (*) 4.0 - 10.5 (K/uL)   RBC 3.37 (*) 3.87 - 5.11 (MIL/uL)   Hemoglobin 10.5 (*) 12.0 - 15.0 (g/dL)   HCT 40.9 (*) 81.1 - 46.0 (%)   MCV 90.8  78.0 - 100.0 (fL)   MCH 31.2  26.0 - 34.0 (pg)   MCHC 34.3  30.0 - 36.0 (g/dL)   RDW 91.4  78.2 - 95.6 (%)   Platelets 284  150 - 400 (K/uL)  DIFFERENTIAL     Status: Abnormal   Collection Time   11/05/11 11:19 AM      Component Value Range   Neutrophils Relative 86 (*) 43 - 77 (%)   Neutro Abs 13.5 (*) 1.7 - 7.7 (K/uL)   Lymphocytes Relative 10 (*) 12 - 46 (%)   Lymphs Abs 1.6  0.7 - 4.0 (K/uL)   Monocytes Relative 4  3 - 12 (%)   Monocytes Absolute 0.6  0.1 - 1.0 (K/uL)   Eosinophils Relative 0  0 - 5 (%)   Eosinophils Absolute 0.0  0.0 - 0.7 (K/uL)   Basophils Relative 0  0 - 1 (%)   Basophils Absolute 0.0  0.0 - 0.1 (K/uL)  COMPREHENSIVE METABOLIC PANEL     Status: Abnormal   Collection Time   11/05/11 11:19 AM      Component Value Range   Sodium 139  135 - 145 (mEq/L)   Potassium 4.0  3.5 - 5.1 (mEq/L)   Chloride 107  96 - 112 (mEq/L)   CO2 26  19 - 32 (mEq/L)   Glucose, Bld 110 (*) 70 - 99 (mg/dL)   BUN 11  6 - 23 (mg/dL)   Creatinine, Ser 2.13  0.50 - 1.10 (mg/dL)   Calcium 8.5  8.4 - 08.6 (mg/dL)   Total Protein 5.8 (*) 6.0 - 8.3 (g/dL)   Albumin 3.1 (*) 3.5 - 5.2 (g/dL)   AST 13  0 - 37 (U/L)   ALT 13  0 - 35 (U/L)   Alkaline Phosphatase 39  39 - 117 (U/L)   Total Bilirubin 0.3  0.3 - 1.2 (mg/dL)   GFR calc non Af Amer >90  >90 (mL/min)   GFR calc Af Amer >90  >90 (mL/min)  LIPASE, BLOOD     Status: Normal   Collection Time   11/05/11 11:19 AM      Component Value Range   Lipase 22  11 - 59 (U/L)    HCG, QUANTITATIVE, PREGNANCY     Status: Abnormal   Collection Time   11/05/11 11:19 AM      Component Value Range   hCG, Beta Chain, Quant, S 4116 (*) <5 (mIU/mL)  URINALYSIS, ROUTINE W REFLEX MICROSCOPIC     Status: Abnormal   Collection Time   11/05/11 12:00 PM  Component Value Range   Color, Urine YELLOW  YELLOW    APPearance CLEAR  CLEAR    Specific Gravity, Urine 1.029  1.005 - 1.030    pH 6.0  5.0 - 8.0    Glucose, UA NEGATIVE  NEGATIVE (mg/dL)   Hgb urine dipstick NEGATIVE  NEGATIVE    Bilirubin Urine SMALL (*) NEGATIVE    Ketones, ur 15 (*) NEGATIVE (mg/dL)   Protein, ur NEGATIVE  NEGATIVE (mg/dL)   Urobilinogen, UA 1.0  0.0 - 1.0 (mg/dL)   Nitrite NEGATIVE  NEGATIVE    Leukocytes, UA NEGATIVE  NEGATIVE   PREGNANCY, URINE     Status: Abnormal   Collection Time   11/05/11 12:00 PM      Component Value Range   Preg Test, Ur POSITIVE (*) NEGATIVE     Ct Head Wo Contrast  11/05/2011  *RADIOLOGY REPORT*  Clinical Data: Seizure, abdominal pain, nausea, vomiting, pregnant  CT HEAD WITHOUT CONTRAST  Technique:  Contiguous axial images were obtained from the base of the skull through the vertex without contrast.  Comparison: None  Findings: Normal ventricular morphology. No midline shift or mass effect. Normal appearance of brain parenchyma. No intracranial hemorrhage, mass lesion, or acute infarction. Visualized paranasal sinuses and mastoid air cells clear. Bones unremarkable.  IMPRESSION: No acute intracranial abnormalities.  Original Report Authenticated By: Lollie Marrow, M.D.   US Ob Comp Less 14 Wks  11/05/2011  *RADIOLOGY REPORT*  Clinical Data: First trimester pregnancy with pelvic pain; evaluate for ectopic pregnancy.  Serum beta HCG level 4116.  OBSTETRIC <14 WK [place] AND TRANSVAGINAL OB US  Technique:  Both transabdominal and transvaginal ultrasound examinations were performed for complete evaluation of the gestation as well as the maternal uterus, adnexal regions, and  pelvic cul-de-sac.  Transvaginal technique was performed to assess early pregnancy.  Comparison:  None relevant.  Intrauterine gestational sac:  None visualized. Yolk sac: None visualized. Embryo: None visualized.  Maternal uterus/adnexae: There is heterogeneous thickening of the endometrium to 2.0 cm.  No intrauterine gestational sac is identified.  The right ovary measures 3.5 x 1.9 x 2.3 cm.  The left ovary is not visualized. There is a complex structure adjacent to the uterine fundus and right ovary which measures 3.1 x 2.6 x 2.9 cm.  This has an echogenic rim, but no significant hypervascularity on color Doppler.  There is a moderate amount of free pelvic fluid.  Some ascites is also seen adjacent to the liver and in both pericolic gutters.  IMPRESSION:  1.  No intrauterine gestation is identified. 2.  Complex adnexal mass superior to the uterus, moderate pelvic fluid and ascites concerning for possible ectopic pregnancy.  Critical Value/emergent results were called by telephone at the time of interpretation on 11/05/2011  at 1610 hours  to  Dr. Estell Harpin, who verbally acknowledged these results.  Original Report Authenticated By: Gerrianne Scale, M.D.   US Ob Transvaginal  11/05/2011  *RADIOLOGY REPORT*  Clinical Data: First trimester pregnancy with pelvic pain; evaluate for ectopic pregnancy.  Serum beta HCG level 4116.  OBSTETRIC <14 WK [place] AND TRANSVAGINAL OB US  Technique:  Both transabdominal and transvaginal ultrasound examinations were performed for complete evaluation of the gestation as well as the maternal uterus, adnexal regions, and pelvic cul-de-sac.  Transvaginal technique was performed to assess early pregnancy.  Comparison:  None relevant.  Intrauterine gestational sac:  None visualized. Yolk sac: None visualized. Embryo: None visualized.  Maternal uterus/adnexae: There is heterogeneous thickening of  the endometrium to 2.0 cm.  No intrauterine gestational sac is identified.  The right ovary  measures 3.5 x 1.9 x 2.3 cm.  The left ovary is not visualized. There is a complex structure adjacent to the uterine fundus and right ovary which measures 3.1 x 2.6 x 2.9 cm.  This has an echogenic rim, but no significant hypervascularity on color Doppler.  There is a moderate amount of free pelvic fluid.  Some ascites is also seen adjacent to the liver and in both pericolic gutters.  IMPRESSION:  1.  No intrauterine gestation is identified. 2.  Complex adnexal mass superior to the uterus, moderate pelvic fluid and ascites concerning for possible ectopic pregnancy.  Critical Value/emergent results were called by telephone at the time of interpretation on 11/05/2011  at 1610 hours  to  Dr. Estell Harpin, who verbally acknowledged these results.  Original Report Authenticated By: Gerrianne Scale, M.D.    Assessment/Plan: Ruptured right ectopic pregnancy.  I have reviewed the ultrasound myself.  There is a large amount of organized clot and free fluid in the pelvis.  The urgency of the situation is discussed with patient.  Methotrexate is not an option--although she asked.  Laparoscopic right salpingectomy discussed with patient.  Risks of bleeding, <1% transfusion risk, DVT/PE, 2-3% bowel/bladder/ureteral injury, and rare risk of death discussed.  Also discussed with patient risk of doing nothing which would mean continued bleeding and probable decompensation including risk of death.  Patient voiced clear understanding before signing consent and proceeding with surgery.  Jordayn Mink,M SUZANNE 11/05/2011, 7:42 PM  10-

## 2011-11-05 NOTE — Anesthesia Preprocedure Evaluation (Signed)
Anesthesia Evaluation  Patient identified by MRN, date of birth, ID band Patient awake    Reviewed: Allergy & Precautions, H&P , NPO status , Patient's Chart, lab work & pertinent test results  Airway Mallampati: III TM Distance: >3 FB Neck ROM: Full    Dental No notable dental hx. (+) Teeth Intact   Pulmonary neg pulmonary ROS,  breath sounds clear to auscultation  Pulmonary exam normal       Cardiovascular negative cardio ROS  Rhythm:Regular Rate:Normal     Neuro/Psych Seizures -,  Had reported Seizure today. CT scan negative. Had another reported seizure 10 years ago. No seizures in the ensuing interval. negative psych ROS   GI/Hepatic negative GI ROS, Neg liver ROS,   Endo/Other  negative endocrine ROS  Renal/GU negative Renal ROS  negative genitourinary   Musculoskeletal negative musculoskeletal ROS (+)   Abdominal (+)  Abdomen: tender.    Peds  Hematology negative hematology ROS (+)   Anesthesia Other Findings   Reproductive/Obstetrics (+) Pregnancy Ectopic pregnancy, ruptured.                            Anesthesia Physical Anesthesia Plan  ASA: II and Emergent  Anesthesia Plan: General   Post-op Pain Management:    Induction: Intravenous  Airway Management Planned:   Additional Equipment:   Intra-op Plan:   Post-operative Plan: Extubation in OR  Informed Consent: I have reviewed the patients History and Physical, chart, labs and discussed the procedure including the risks, benefits and alternatives for the proposed anesthesia with the patient or authorized representative who has indicated his/her understanding and acceptance.   Dental advisory given  Plan Discussed with: CRNA, Anesthesiologist and Surgeon  Anesthesia Plan Comments:         Anesthesia Quick Evaluation

## 2011-11-05 NOTE — ED Provider Notes (Cosign Needed)
History     CSN: [id number]  Arrival date & time 11/05/11  4098   First MD Initiated Contact with Patient 11/05/11 1039      Chief Complaint  Patient presents with  . Abdominal Pain    (Consider location/radiation/quality/duration/timing/severity/associated sxs/prior treatment) Patient is a 36 y.o. female presenting with abdominal pain. The history is provided by the patient (Patient complains of lower abdominal pain for a day patient was in the emergency department and had an episode where she passed out may have had a small seizure.).  Abdominal Pain The primary symptoms of the illness include abdominal pain. The primary symptoms of the illness do not include fever, fatigue, shortness of breath or diarrhea. The current episode started 3 to 5 hours ago. The onset of the illness was gradual. The problem has been gradually improving.  Associated with: nothing. The patient states that she believes she is currently pregnant. The patient has not had a change in bowel habit. Symptoms associated with the illness do not include heartburn, hematuria, frequency or back pain. Significant associated medical issues do not include GERD.    Past Medical History  Diagnosis Date  . Seizures     No past surgical history on file.  No family history on file.  History  Substance Use Topics  . Smoking status: Not on file  . Smokeless tobacco: Not on file  . Alcohol Use:     OB History    Grav Para Term Preterm Abortions TAB SAB Ect Mult Living   1               Review of Systems  Constitutional: Negative for fever and fatigue.  HENT: Negative for congestion, sinus pressure and ear discharge.   Eyes: Negative for discharge.  Respiratory: Negative for cough and shortness of breath.   Cardiovascular: Negative for chest pain.  Gastrointestinal: Positive for abdominal pain. Negative for heartburn and diarrhea.  Genitourinary: Negative for frequency and hematuria.  Musculoskeletal: Negative  for back pain.  Skin: Negative for rash.  Neurological: Negative for seizures and headaches.  Hematological: Negative.   Psychiatric/Behavioral: Negative for hallucinations.    Allergies  Review of patient's allergies indicates no known allergies.  Home Medications   Current Outpatient Rx  Name Route Sig Dispense Refill  . LEVONORGEST-ETH ESTRAD 91-DAY 0.15-0.03 MG PO TABS Oral Take 1 tablet by mouth daily.      BP 102/60  Pulse 67  Temp 98.6 F (37 C)  Resp 18  SpO2 99%  Physical Exam  Constitutional: She is oriented to person, place, and time. She appears well-developed.  HENT:  Head: Normocephalic and atraumatic.  Eyes: Conjunctivae and EOM are normal. No scleral icterus.  Neck: Neck supple. No thyromegaly present.  Cardiovascular: Normal rate and regular rhythm.  Exam reveals no gallop and no friction rub.   No murmur heard. Pulmonary/Chest: No stridor. She has no wheezes. She has no rales. She exhibits no tenderness.  Abdominal: She exhibits no distension. There is tenderness. There is no rebound.       Mild tendernous llq and rlq  Genitourinary:       Os closed,  Tender right adenexal,  No discharge  Musculoskeletal: Normal range of motion. She exhibits no edema.  Lymphadenopathy:    She has no cervical adenopathy.  Neurological: She is oriented to person, place, and time. Coordination normal.  Skin: No rash noted. No erythema.  Psychiatric: She has a normal mood and affect. Her behavior is normal.  ED Course  Procedures (including critical care time)  Labs Reviewed  CBC - Abnormal; Notable for the following:    WBC 15.7 (*)    RBC 3.37 (*)    Hemoglobin 10.5 (*)    HCT 30.6 (*)    All other components within normal limits  DIFFERENTIAL - Abnormal; Notable for the following:    Neutrophils Relative 86 (*)    Neutro Abs 13.5 (*)    Lymphocytes Relative 10 (*)    All other components within normal limits  COMPREHENSIVE METABOLIC PANEL - Abnormal;  Notable for the following:    Glucose, Bld 110 (*)    Total Protein 5.8 (*)    Albumin 3.1 (*)    All other components within normal limits  URINALYSIS, ROUTINE W REFLEX MICROSCOPIC - Abnormal; Notable for the following:    Bilirubin Urine SMALL (*)    Ketones, ur 15 (*)    All other components within normal limits  PREGNANCY, URINE - Abnormal; Notable for the following:    Preg Test, Ur POSITIVE (*)    All other components within normal limits  HCG, QUANTITATIVE, PREGNANCY - Abnormal; Notable for the following:    hCG, Beta Chain, Quant, S 4116 (*)    All other components within normal limits  LIPASE, BLOOD   Ct Head Wo Contrast  11/05/2011  *RADIOLOGY REPORT*  Clinical Data: Seizure, abdominal pain, nausea, vomiting, pregnant  CT HEAD WITHOUT CONTRAST  Technique:  Contiguous axial images were obtained from the base of the skull through the vertex without contrast.  Comparison: None  Findings: Normal ventricular morphology. No midline shift or mass effect. Normal appearance of brain parenchyma. No intracranial hemorrhage, mass lesion, or acute infarction. Visualized paranasal sinuses and mastoid air cells clear. Bones unremarkable.  IMPRESSION: No acute intracranial abnormalities.  Original Report Authenticated By: Lollie Marrow, M.D.     No diagnosis found.  Results for orders placed during the hospital encounter of 11/05/11  CBC      Component Value Range   WBC 15.7 (*) 4.0 - 10.5 (K/uL)   RBC 3.37 (*) 3.87 - 5.11 (MIL/uL)   Hemoglobin 10.5 (*) 12.0 - 15.0 (g/dL)   HCT 16.1 (*) 09.6 - 46.0 (%)   MCV 90.8  78.0 - 100.0 (fL)   MCH 31.2  26.0 - 34.0 (pg)   MCHC 34.3  30.0 - 36.0 (g/dL)   RDW 04.5  40.9 - 81.1 (%)   Platelets 284  150 - 400 (K/uL)  DIFFERENTIAL      Component Value Range   Neutrophils Relative 86 (*) 43 - 77 (%)   Neutro Abs 13.5 (*) 1.7 - 7.7 (K/uL)   Lymphocytes Relative 10 (*) 12 - 46 (%)   Lymphs Abs 1.6  0.7 - 4.0 (K/uL)   Monocytes Relative 4  3 - 12  (%)   Monocytes Absolute 0.6  0.1 - 1.0 (K/uL)   Eosinophils Relative 0  0 - 5 (%)   Eosinophils Absolute 0.0  0.0 - 0.7 (K/uL)   Basophils Relative 0  0 - 1 (%)   Basophils Absolute 0.0  0.0 - 0.1 (K/uL)  COMPREHENSIVE METABOLIC PANEL      Component Value Range   Sodium 139  135 - 145 (mEq/L)   Potassium 4.0  3.5 - 5.1 (mEq/L)   Chloride 107  96 - 112 (mEq/L)   CO2 26  19 - 32 (mEq/L)   Glucose, Bld 110 (*) 70 - 99 (mg/dL)   BUN  11  6 - 23 (mg/dL)   Creatinine, Ser 1.61  0.50 - 1.10 (mg/dL)   Calcium 8.5  8.4 - 09.6 (mg/dL)   Total Protein 5.8 (*) 6.0 - 8.3 (g/dL)   Albumin 3.1 (*) 3.5 - 5.2 (g/dL)   AST 13  0 - 37 (U/L)   ALT 13  0 - 35 (U/L)   Alkaline Phosphatase 39  39 - 117 (U/L)   Total Bilirubin 0.3  0.3 - 1.2 (mg/dL)   GFR calc non Af Amer >90  >90 (mL/min)   GFR calc Af Amer >90  >90 (mL/min)  LIPASE, BLOOD      Component Value Range   Lipase 22  11 - 59 (U/L)  URINALYSIS, ROUTINE W REFLEX MICROSCOPIC      Component Value Range   Color, Urine YELLOW  YELLOW    APPearance CLEAR  CLEAR    Specific Gravity, Urine 1.029  1.005 - 1.030    pH 6.0  5.0 - 8.0    Glucose, UA NEGATIVE  NEGATIVE (mg/dL)   Hgb urine dipstick NEGATIVE  NEGATIVE    Bilirubin Urine SMALL (*) NEGATIVE    Ketones, ur 15 (*) NEGATIVE (mg/dL)   Protein, ur NEGATIVE  NEGATIVE (mg/dL)   Urobilinogen, UA 1.0  0.0 - 1.0 (mg/dL)   Nitrite NEGATIVE  NEGATIVE    Leukocytes, UA NEGATIVE  NEGATIVE   PREGNANCY, URINE      Component Value Range   Preg Test, Ur POSITIVE (*) NEGATIVE   HCG, QUANTITATIVE, PREGNANCY      Component Value Range   hCG, Beta Chain, Quant, S 4116 (*) <5 (mIU/mL)   Ct Head Wo Contrast  11/05/2011  *RADIOLOGY REPORT*  Clinical Data: Seizure, abdominal pain, nausea, vomiting, pregnant  CT HEAD WITHOUT CONTRAST  Technique:  Contiguous axial images were obtained from the base of the skull through the vertex without contrast.  Comparison: None  Findings: Normal ventricular morphology.  No midline shift or mass effect. Normal appearance of brain parenchyma. No intracranial hemorrhage, mass lesion, or acute infarction. Visualized paranasal sinuses and mastoid air cells clear. Bones unremarkable.  IMPRESSION: No acute intracranial abnormalities.  Original Report Authenticated By: Lollie Marrow, M.D.      MDM   Possible ectopic       Benny Lennert, MD 11/05/11 [id number]

## 2011-11-05 NOTE — ED Notes (Signed)
[facility] called for transportation to [facility]. Dr. Hyacinth Meeker

## 2011-11-05 NOTE — ED Notes (Signed)
GYN PAGED

## 2011-11-05 NOTE — ED Notes (Signed)
MD at bedside. 

## 2011-11-05 NOTE — ED Notes (Signed)
Pt taken to CT.

## 2011-11-05 NOTE — ED Notes (Signed)
States got up this am and started to have abd pain  Nausea no vomit

## 2011-11-05 NOTE — Anesthesia Postprocedure Evaluation (Signed)
Anesthesia Post Note  Patient: Katie Malone  Procedure(s) Performed: Procedure(s) (LRB): LAPAROSCOPY OPERATIVE (N/A)  Anesthesia type: General  Patient location: PACU  Post pain: Pain level controlled  Post assessment: Post-op Vital signs reviewed  Last Vitals:  Filed Vitals:   11/05/11 2100  BP: 110/64  Pulse:   Temp: 37.1 C  Resp: 18    Post vital signs: Reviewed  Level of consciousness: sedated  Complications: No apparent anesthesia complications

## 2011-11-05 NOTE — ED Notes (Signed)
Pt requesting pain meds.  10/10 abdonimal pain

## 2011-11-05 NOTE — Op Note (Signed)
11/05/2011  7:57 PM  PATIENT:  Katie Malone  36 y.o. female G8P6TAB with ruptured right ectopic pregnancy, hemoperitoneum, history of seizure  PRE-OPERATIVE DIAGNOSIS:  Ectopic Pregnancy, hemoperitoneum  POST-OPERATIVE DIAGNOSIS:  Ectopic Pregnancy, hemoperitoneum  PROCEDURE:  Procedure(s): LAPAROSCOPY OPERATIVE with RIGHT SALPINGECTOMY  SURGEON:  Lillieann Pavlich,M SUZANNE  ASSISTANTS: OR staff   ANESTHESIA:   general, Drs. Foster and [employer] oversaw the case  ESTIMATED BLOOD LOSS:500cc which was mostly present at the beginning of the procedure.  There was active bleeding from the right tube but this was stopped almost immediately after beginning the procedure  BLOOD ADMINISTERED:none   FLUIDS: 2600ccLR  UOP: 400cc clear  SPECIMEN:  Right tube containing ectopic pregnancy  DISPOSITION OF SPECIMEN:  PATHOLOGY  FINDINGS: large amount of blood and clot in the pelvis.  Enlarged tube on the right with active bleeding  DESCRIPTION OF OPERATION: Patient was taken to the operating room. She is placed in the supine position. SCDs were on her lower extremities and functioning properly. General endotracheal anesthesia was administered by the anesthesia staff. The left arm was tucked and the right arm was left out.  Legs were then positioned in the low lithotomy position in [place] stirrups. Timeout was performed. The abdomen was prepped with chlor prep and the perineum, inner thighs and vagina were prepped with Betadine x3. The legs were lifted to the high lithotomy position. A bivalve speculum was placed in the vagina. The anterior lip of the cervix was grasped with single-tooth tenaculum. A Hulka clamp was advanced to the cervical canal and attached to the anterior lip of the cervix as a means of manipulating uterus during the procedure. The tenaculum and bivalve speculum were removed. A Foley catheter was placed in the bladder to straight drain. Patient was draped in a normal standard fashion. Legs  were lowered to the low lithotomy position.  Attention was turned to the umbilicus. Quarter percent Marcaine was used to anesthetize the skin underneath the umbilicus. A 10 mm skin incision was made with a #11 blade.  The subcutaneous fat and tissue was dissected with hemostat. The abdomen was elevated. A 10 mm Optiview port was attached to the laparoscope and with a twisting motion this was advanced to the abdomen. Once intraperitoneal placement was noted, the laparoscope was then used to survey the upper abdomen and pelvis. The liver edge is normal. The gallbladder appeared normal. The stomach edge appeared normal. There was blood in the right gutter. There is a large clot on the bowels and omentum. There is blood in the pelvis and an anterior cul-de-sac. The skin was transilluminated to look for abdominal wall vasculature as a right and left lower quadrant port was necessary.  Once these locations were chosen the skin was anesthetized with quarter percent Marcaine solution, the skin nicked with a 11 blade, and a 5 mm port was placed in the right lower quadrant a 12 mm port was placed the left lower quadrant. Placement was done under direct visualization of the laparoscope.  A Cobra grasper was used to help visualize the tubes and ovaries on each side. The right tube was dilated and an actively bleeding. The right ureter was noted. With the tube on stretch, using Enseal device the tube was excised off by dissecting the mesosalpinx and then transecting the tube close the uterus at the cornua. There was no active bleeding along the dissection and hemostasis was achieved with this procedure.  An Endo Catch bag was used to then grasp the  tube and ectopic pregnancy which were then delivered out of the 12 mm port on the left side. The pelvis was irrigated and using a Nez[place] suction irrigator as much blood and clot as possible was removed. There was a least 500 cc of EBL and clot.  Because of the amount of clot that  was present there was some I could not get off of the bowels in at least 90% of the clot was removed. At this point the pedicle and the dissection was revisualized. Photodocumentation was made before-and-after the procedure. There was no active bleeding. The ureter was noted on the right side with normal peristalsis.  At this point the procedure was ended.  The right and left lower quadrant ports were removed under direct visualization of the laparoscope. The laparoscope was removed and the CRNA give the patient several deep breaths to try and give any gas the abdomen. The midline port was removed. The midline and left lower quadrant ports were closed at the fascial level with figure-of-eight suture of #0 Vicryl. Then the skin at all 3 levels was cleansed and closed with Dermabond. Sponge, laps, needle, instrument counts were correct x2. The patient tolerated the procedure well. She was awakened from anesthesia and extubated without difficulty. She was and taken to the room in stable condition.  COUNTS:  YES  PLAN OF CARE: Transfer to PACU

## 2011-11-06 LAB — RH IG WORKUP (INCLUDES ABO/RH)
ABO/RH(D): A NEG
Antibody Screen: NEGATIVE
Gestational Age(Wks): 8
Unit division: 0

## 2011-11-09 ENCOUNTER — Encounter (HOSPITAL_COMMUNITY): Payer: Self-pay | Admitting: Obstetrics & Gynecology

## 2012-01-20 ENCOUNTER — Encounter: Payer: Self-pay | Admitting: Family Medicine

## 2012-02-14 ENCOUNTER — Encounter (HOSPITAL_COMMUNITY): Payer: Self-pay | Admitting: *Deleted

## 2012-02-14 ENCOUNTER — Inpatient Hospital Stay (HOSPITAL_COMMUNITY): Payer: Medicaid Other

## 2012-02-14 ENCOUNTER — Inpatient Hospital Stay (HOSPITAL_COMMUNITY)
Admission: AD | Admit: 2012-02-14 | Discharge: 2012-02-14 | Disposition: A | Discharge status: Home / Self Care | Payer: Medicaid Other | Source: Ambulatory Visit | Attending: Obstetrics and Gynecology | Admitting: Obstetrics and Gynecology

## 2012-02-14 DIAGNOSIS — R109 Unspecified abdominal pain: Secondary | ICD-10-CM | POA: Insufficient documentation

## 2012-02-14 DIAGNOSIS — N949 Unspecified condition associated with female genital organs and menstrual cycle: Secondary | ICD-10-CM

## 2012-02-14 DIAGNOSIS — B9689 Other specified bacterial agents as the cause of diseases classified elsewhere: Secondary | ICD-10-CM | POA: Insufficient documentation

## 2012-02-14 DIAGNOSIS — A499 Bacterial infection, unspecified: Secondary | ICD-10-CM | POA: Insufficient documentation

## 2012-02-14 DIAGNOSIS — N760 Acute vaginitis: Secondary | ICD-10-CM | POA: Insufficient documentation

## 2012-02-14 LAB — URINALYSIS, ROUTINE W REFLEX MICROSCOPIC
Bilirubin Urine: NEGATIVE
Specific Gravity, Urine: 1.01 (ref 1.005–1.030)
pH: 6.5 (ref 5.0–8.0)

## 2012-02-14 LAB — CBC
HCT: 38 % (ref 36.0–46.0)
Hemoglobin: 12.7 g/dL (ref 12.0–15.0)
MCH: 29.3 pg (ref 26.0–34.0)
MCHC: 33.4 g/dL (ref 30.0–36.0)
MCV: 87.6 fL (ref 78.0–100.0)
Platelets: 261 10*3/uL (ref 150–400)
RBC: 4.34 MIL/uL (ref 3.87–5.11)
RDW: 13.3 % (ref 11.5–15.5)
WBC: 10 10*3/uL (ref 4.0–10.5)

## 2012-02-14 LAB — WET PREP, GENITAL
Trich, Wet Prep: NONE SEEN
WBC, Wet Prep HPF POC: NONE SEEN
Yeast Wet Prep HPF POC: NONE SEEN

## 2012-02-14 LAB — URINE MICROSCOPIC-ADD ON

## 2012-02-14 MED ORDER — KETOROLAC TROMETHAMINE 30 MG/ML IJ SOLN: 30.0000 mg | Freq: Once | INTRAMUSCULAR | Status: DC

## 2012-02-14 MED ORDER — METRONIDAZOLE 500 MG PO TABS: 500.0000 mg | ORAL_TABLET | Freq: Two times a day (BID) | ORAL | Status: AC

## 2012-02-14 MED ORDER — KETOROLAC TROMETHAMINE 30 MG/ML IJ SOLN
30.0000 mg | Freq: Once | INTRAMUSCULAR | Status: AC
  Administered 2012-02-14: 30 mg via INTRAMUSCULAR
  Filled 2012-02-14: qty 1

## 2012-02-14 NOTE — MAU Provider Note (Signed)
History     CSN: [id number]  Arrival date and time: 02/14/12 1357   First Provider Initiated Contact with Patient 02/14/12 1622      Chief Complaint  Patient presents with  . Abdominal Pain   HPI Katie Malone is a 36 y.o.  [id number]. She c/o bil low abd pain since 11 am, is constant sharp/stabbing. No change in discharge, no UTI S&S or GI changes. Last BM 2 d ago, nl. Pain started after IC this am.    Past Medical History  Diagnosis Date  . Seizures     Last seizure March 2013    Past Surgical History  Procedure Date  . Wisdom tooth extraction   . Laparoscopy 11/05/2011    Procedure: LAPAROSCOPY OPERATIVE;  Surgeon: Jerene Bears, MD;  Location: [facility];  Service: Gynecology;  Laterality: N/A;  Right Salpingectomy    History reviewed. No pertinent family history.  History  Substance Use Topics  . Smoking status: Current Everyday Smoker -- 0.5 packs/day    Types: Cigarettes  . Smokeless tobacco: Not on file  . Alcohol Use: No    Allergies: No Known Allergies  Prescriptions prior to admission  Medication Sig Dispense Refill  . PRESCRIPTION MEDICATION Take 1 tablet by mouth daily. Birth Control, Pt does not know name states she was given the medication by an M.D not filled at a pharmacy.  Pt has been on Seasonale in past until switching to current unknown Rx.        Review of Systems  Constitutional: Negative for fever and chills.  Gastrointestinal: Positive for abdominal pain. Negative for nausea, vomiting, diarrhea and constipation.  Genitourinary: Negative for dysuria, urgency and frequency.   Physical Exam   Blood pressure 101/75, pulse 87, temperature 98.9 F (37.2 C), temperature source Oral, resp. rate 18, height 5\' 3"  (1.6 m), weight 146 lb 3.2 oz (66.316 kg), last menstrual period 01/16/2012, unknown if currently breastfeeding.  Physical Exam  Constitutional: She is oriented to person, place, and time. She appears well-developed and well-nourished.    GI: Soft. Bowel sounds are normal. She exhibits no distension and no mass. There is tenderness. There is guarding. There is no rebound.  Genitourinary: There is no rash, tenderness or lesion on the right labia. There is no rash, tenderness or lesion on the left labia. Uterus is tender. Uterus is not deviated, not enlarged and not fixed. Cervix exhibits motion tenderness. Cervix exhibits no discharge and no friability. Right adnexum displays tenderness. Right adnexum displays no mass and no fullness. Left adnexum displays tenderness. Left adnexum displays no mass and no fullness. No bleeding around the vagina. Vaginal discharge found.  Musculoskeletal: Normal range of motion.  Neurological: She is alert and oriented to person, place, and time.  Skin: Skin is warm and dry.  Psychiatric: She has a normal mood and affect. Her behavior is normal.    MAU Course  Procedures  MDM Results for orders placed during the hospital encounter of 02/14/12 (from the past 24 hour(s))  URINALYSIS, ROUTINE W REFLEX MICROSCOPIC     Status: Abnormal   Collection Time   02/14/12  2:30 PM      Component Value Range   Color, Urine YELLOW  YELLOW   APPearance HAZY (*) CLEAR   Specific Gravity, Urine 1.010  1.005 - 1.030   pH 6.5  5.0 - 8.0   Glucose, UA NEGATIVE  NEGATIVE mg/dL   Hgb urine dipstick TRACE (*) NEGATIVE   Bilirubin Urine NEGATIVE  NEGATIVE   Ketones, ur NEGATIVE  NEGATIVE mg/dL   Protein, ur NEGATIVE  NEGATIVE mg/dL   Urobilinogen, UA 0.2  0.0 - 1.0 mg/dL   Nitrite NEGATIVE  NEGATIVE   Leukocytes, UA NEGATIVE  NEGATIVE  URINE MICROSCOPIC-ADD ON     Status: Abnormal   Collection Time   02/14/12  2:30 PM      Component Value Range   Squamous Epithelial / LPF RARE  RARE   WBC, UA 0-2  <3 WBC/hpf   RBC / HPF 0-2  <3 RBC/hpf   Bacteria, UA FEW (*) RARE  POCT PREGNANCY, URINE     Status: Normal   Collection Time   02/14/12  2:40 PM      Component Value Range   Preg Test, Ur NEGATIVE  NEGATIVE   WET PREP, GENITAL     Status: Abnormal   Collection Time   02/14/12  4:30 PM      Component Value Range   Yeast Wet Prep HPF POC NONE SEEN  NONE SEEN   Trich, Wet Prep NONE SEEN  NONE SEEN   Clue Cells Wet Prep HPF POC MODERATE (*) NONE SEEN   WBC, Wet Prep HPF POC NONE SEEN  NONE SEEN  CBC     Status: Normal   Collection Time   02/14/12  4:40 PM      Component Value Range   WBC 10.0  4.0 - 10.5 K/uL   RBC 4.34  3.87 - 5.11 MIL/uL   Hemoglobin 12.7  12.0 - 15.0 g/dL   HCT 14.7  82.9 - 56.2 %   MCV 87.6  78.0 - 100.0 fL   MCH 29.3  26.0 - 34.0 pg   MCHC 33.4  30.0 - 36.0 g/dL   RDW 13.0  86.5 - 78.4 %   Platelets 261  150 - 400 K/uL   US Transvaginal Non-ob  02/14/2012  *RADIOLOGY REPORT*  Clinical Data: Pelvic pain.  TRANSABDOMINAL AND TRANSVAGINAL ULTRASOUND OF PELVIS  Technique:  Both transabdominal and transvaginal ultrasound examinations of the pelvis were performed including evaluation of the uterus, ovaries, adnexal regions, and pelvic cul-de-sac.  Comparison: 11/05/2011  Findings:  Uterus: 8.5 x 4.3 x 5.5 cm.  Normal echotexture.  No focal abnormality.  Endometrium: Normal appearance and thickness, 6 mm.  Right Ovary: 3.3 x 1.7 x 2.2 cm. Normal size and echotexture.  No adnexal masses.  Normal color flow.  Left Ovary: 4.3 x 2.0 x 3.3 cm.  1.8 cm corpus luteum cyst.  Normal color flow.  Other Findings:  Trace free fluid.  IMPRESSION: No acute or significant abnormality.  Original Report Authenticated By: Cyndie Chime, M.D.   US Pelvis Complete  02/14/2012  *RADIOLOGY REPORT*  Clinical Data: Pelvic pain.  TRANSABDOMINAL AND TRANSVAGINAL ULTRASOUND OF PELVIS  Technique:  Both transabdominal and transvaginal ultrasound examinations of the pelvis were performed including evaluation of the uterus, ovaries, adnexal regions, and pelvic cul-de-sac.  Comparison: 11/05/2011  Findings:  Uterus: 8.5 x 4.3 x 5.5 cm.  Normal echotexture.  No focal abnormality.  Endometrium: Normal appearance and  thickness, 6 mm.  Right Ovary: 3.3 x 1.7 x 2.2 cm. Normal size and echotexture.  No adnexal masses.  Normal color flow.  Left Ovary: 4.3 x 2.0 x 3.3 cm.  1.8 cm corpus luteum cyst.  Normal color flow.  Other Findings:  Trace free fluid.  IMPRESSION: No acute or significant abnormality.  Original Report Authenticated By: Cyndie Chime, M.D.  Assessment and Plan  Bacterial vaginosis  No cause for pelvic pain, could be uro, GI or muscular/skeletal.  Flagyl 500 mg BID x 7 Gets her primary care with [facility], to call for f/u   Rodell Perna. 02/14/2012, 4:34 PM

## 2012-02-14 NOTE — MAU Note (Signed)
Pt reports  Having abd that started this morning. Pt was here 2 month ago and had surgery for and ectopic pregnancy. Katie Malone had f/u visit in office and is using birth control. Pt report [name] is the same as it was when she had the ectopic.

## 2012-02-14 NOTE — Discharge Instructions (Signed)
Bacterial Vaginosis Bacterial vaginosis (BV) is a vaginal infection where the normal balance of bacteria in the vagina is disrupted. The normal balance is then replaced by an overgrowth of certain bacteria. There are several different kinds of bacteria that can cause BV. BV is the most common vaginal infection in women of childbearing age. CAUSES   The cause of BV is not fully understood. BV develops when there is an increase or imbalance of harmful bacteria.   Some activities or behaviors can upset the normal balance of bacteria in the vagina and put women at increased risk including:   Having a new sex partner or multiple sex partners.   Douching.   Using an intrauterine device (IUD) for contraception.   It is not clear what role sexual activity plays in the development of BV. However, women that have never had sexual intercourse are rarely infected with BV.  Women do not get BV from toilet seats, bedding, swimming pools or from touching objects around them.  SYMPTOMS   Grey vaginal discharge.   A fish-like odor with discharge, especially after sexual intercourse.   Itching or burning of the vagina and vulva.   Burning or pain with urination.   Some women have no signs or symptoms at all.  DIAGNOSIS  Your caregiver must examine the vagina for signs of BV. Your caregiver will perform lab tests and look at the sample of vaginal fluid through a microscope. They will look for bacteria and abnormal cells (clue cells), a pH test higher than 4.5, and a positive amine test all associated with BV.  RISKS AND COMPLICATIONS   Pelvic inflammatory disease (PID).   Infections following gynecology surgery.   Developing HIV.   Developing herpes virus.  TREATMENT  Sometimes BV will clear up without treatment. However, all women with symptoms of BV should be treated to avoid complications, especially if gynecology surgery is planned. Female partners generally do not need to be treated. However,  BV may spread between female sex partners so treatment is helpful in preventing a recurrence of BV.   BV may be treated with antibiotics. The antibiotics come in either pill or vaginal cream forms. Either can be used with nonpregnant or pregnant women, but the recommended dosages differ. These antibiotics are not harmful to the baby.   BV can recur after treatment. If this happens, a second round of antibiotics will often be prescribed.   Treatment is important for pregnant women. If not treated, BV can cause a premature delivery, especially for a pregnant woman who had a premature birth in the past. All pregnant women who have symptoms of BV should be checked and treated.   For chronic reoccurrence of BV, treatment with a type of prescribed gel vaginally twice a week is helpful.  HOME CARE INSTRUCTIONS   Finish all medication as directed by your caregiver.   Do not have sex until treatment is completed.   Tell your sexual partner that you have a vaginal infection. They should see their caregiver and be treated if they have problems, such as a mild rash or itching.   Practice safe sex. Use condoms. Only have 1 sex partner.  PREVENTION  Basic prevention steps can help reduce the risk of upsetting the natural balance of bacteria in the vagina and developing BV:  Do not have sexual intercourse (be abstinent).   Do not douche.   Use all of the medicine prescribed for treatment of BV, even if the signs and symptoms go away.     Tell your sex partner if you have BV. That way, they can be treated, if needed, to prevent reoccurrence.  SEEK MEDICAL CARE IF:   Your symptoms are not improving after 3 days of treatment.   You have increased discharge, pain, or fever.  MAKE SURE YOU:   Understand these instructions.   Will watch your condition.   Will get help right away if you are not doing well or get worse.  FOR MORE INFORMATION  Division of STD Prevention (DSTDP), Centers for Disease  Control and Prevention: SolutionApps.[employer].za American Social Health Association (ASHA): www.ashastd.org  Document Released: 08/03/2005 Document Revised: 07/23/2011 Document Reviewed: 01/24/2009 ExitCare Patient Information 2012 ExitCare, LLCAbdominal Pain, Women Abdominal (stomach, pelvic, or belly) pain can be caused by many things. It is important to tell your doctor:  The location of the pain.   Does it come and go or is it present all the time?   Are there things that start the pain (eating certain foods, exercise)?   Are there other symptoms associated with the pain (fever, nausea, vomiting, diarrhea)?  All of this is helpful to know when trying to find the cause of the pain. CAUSES   Stomach: virus or bacteria infection, or ulcer.   Intestine: appendicitis (inflamed appendix), regional ileitis (Crohn's disease), ulcerative colitis (inflamed colon), irritable bowel syndrome, diverticulitis (inflamed diverticulum of the colon), or cancer of the stomach or intestine.   Gallbladder disease or stones in the gallbladder.   Kidney disease, kidney stones, or infection.   Pancreas infection or cancer.   Fibromyalgia (pain disorder).   Diseases of the female organs:   Uterus: fibroid (non-cancerous) tumors or infection.   Fallopian tubes: infection or tubal pregnancy.   Ovary: cysts or tumors.   Pelvic adhesions (scar tissue).   Endometriosis (uterus lining tissue growing in the pelvis and on the pelvic organs).   Pelvic congestion syndrome (female organs filling up with blood just before the menstrual period).   Pain with the menstrual period.   Pain with ovulation (producing an egg).   Pain with an IUD (intrauterine device, birth control) in the uterus.   Cancer of the female organs.   Functional pain (pain not caused by a disease, may improve without treatment).   Psychological pain.   Depression.  DIAGNOSIS  Your doctor will decide the seriousness of your pain by  doing an examination.  Blood tests.   X-rays.   Ultrasound.   CT scan (computed tomography, special type of X-ray).   MRI (magnetic resonance imaging).   Cultures, for infection.   Barium enema (dye inserted in the large intestine, to better view it with X-rays).   Colonoscopy (looking in intestine with a lighted tube).   Laparoscopy (minor surgery, looking in abdomen with a lighted tube).   Major abdominal exploratory surgery (looking in abdomen with a large incision).  TREATMENT  The treatment will depend on the cause of the pain.   Many cases can be observed and treated at home.   Over-the-counter medicines recommended by your caregiver.   Prescription medicine.   Antibiotics, for infection.   Birth control pills, for painful periods or for ovulation pain.   Hormone treatment, for endometriosis.   Nerve blocking injections.   Physical therapy.   Antidepressants.   Counseling with a psychologist or psychiatrist.   Minor or major surgery.  HOME CARE INSTRUCTIONS   Do not take laxatives, unless directed by your caregiver.   Take over-the-counter pain medicine only if ordered by your  caregiver. Do not take aspirin because it can cause an upset stomach or bleeding.   Try a clear liquid diet (broth or water) as ordered by your caregiver. Slowly move to a bland diet, as tolerated, if the pain is related to the stomach or intestine.   Have a thermometer and take your temperature several times a day, and record it.   Bed rest and sleep, if it helps the pain.   Avoid sexual intercourse, if it causes pain.   Avoid stressful situations.   Keep your follow-up appointments and tests, as your caregiver orders.   If the pain does not go away with medicine or surgery, you may try:   Acupuncture.   Relaxation exercises (yoga, meditation).   Group therapy.   Counseling.  SEEK MEDICAL CARE IF:   You notice certain foods cause stomach pain.   Your home care  treatment is not helping your pain.   You need stronger pain medicine.   You want your IUD removed.   You feel faint or lightheaded.   You develop nausea and vomiting.   You develop a rash.   You are having side effects or an allergy to your medicine.  SEEK IMMEDIATE MEDICAL CARE IF:   Your pain does not go away or gets worse.   You have a fever.   Your pain is felt only in portions of the abdomen. The right side could possibly be appendicitis. The left lower portion of the abdomen could be colitis or diverticulitis.   You are passing blood in your stools (bright red or black tarry stools, with or without vomiting).   You have blood in your urine.   You develop chills, with or without a fever.   You pass out.  MAKE SURE YOU:   Understand these instructions.   Will watch your condition.   Will get help right away if you are not doing well or get worse.  Document Released: 05/31/2007 Document Revised: 07/23/2011 Document Reviewed: 06/20/2009 [facility] Patient Information 2012 [place], [place].Marland Kitchen

## 2012-02-14 NOTE — MAU Note (Signed)
Pt states her pain started after intercourse this a.m., is very similar to previous ectopic pain.

## 2012-02-15 LAB — GC/CHLAMYDIA PROBE AMP, GENITAL
Chlamydia, DNA Probe: NEGATIVE
GC Probe Amp, Genital: NEGATIVE

## 2012-02-21 NOTE — MAU Provider Note (Signed)
Attestation of Attending Supervision of Advanced Practitioner: Evaluation and management procedures were performed by the PA/NP/CNM/OB Fellow under my supervision/collaboration. Chart reviewed and agree with management and plan.  Ellysia Char V 02/21/2012 10:53 AM    

## 2012-07-13 ENCOUNTER — Inpatient Hospital Stay (HOSPITAL_COMMUNITY)
Admission: AD | Admit: 2012-07-13 | Discharge: 2012-07-13 | Disposition: A | Discharge status: Home / Self Care | Payer: Medicaid Other | Source: Ambulatory Visit | Attending: Family Medicine | Admitting: Family Medicine

## 2012-07-13 ENCOUNTER — Encounter (HOSPITAL_COMMUNITY): Payer: Self-pay | Admitting: Family

## 2012-07-13 ENCOUNTER — Inpatient Hospital Stay (HOSPITAL_COMMUNITY): Payer: Medicaid Other

## 2012-07-13 DIAGNOSIS — W108XXA Fall (on) (from) other stairs and steps, initial encounter: Secondary | ICD-10-CM | POA: Insufficient documentation

## 2012-07-13 DIAGNOSIS — O99891 Other specified diseases and conditions complicating pregnancy: Secondary | ICD-10-CM | POA: Insufficient documentation

## 2012-07-13 DIAGNOSIS — R109 Unspecified abdominal pain: Secondary | ICD-10-CM | POA: Insufficient documentation

## 2012-07-13 DIAGNOSIS — M549 Dorsalgia, unspecified: Secondary | ICD-10-CM | POA: Insufficient documentation

## 2012-07-13 DIAGNOSIS — Y92009 Unspecified place in unspecified non-institutional (private) residence as the place of occurrence of the external cause: Secondary | ICD-10-CM | POA: Insufficient documentation

## 2012-07-13 LAB — URINE MICROSCOPIC-ADD ON

## 2012-07-13 LAB — URINALYSIS, ROUTINE W REFLEX MICROSCOPIC
Bilirubin Urine: NEGATIVE
Glucose, UA: NEGATIVE mg/dL
Ketones, ur: 15 mg/dL — AB
Leukocytes, UA: NEGATIVE
pH: 6.5 (ref 5.0–8.0)

## 2012-07-13 LAB — CBC
Hemoglobin: 11.6 g/dL — ABNORMAL LOW (ref 12.0–15.0)
MCH: 31 pg (ref 26.0–34.0)
MCHC: 35 g/dL (ref 30.0–36.0)

## 2012-07-13 LAB — HCG, QUANTITATIVE, PREGNANCY: hCG, Beta Chain, Quant, S: 410 m[IU]/mL — ABNORMAL HIGH (ref <5)

## 2012-07-13 LAB — WET PREP, GENITAL

## 2012-07-13 NOTE — MAU Note (Addendum)
Pt reports at 0603 today she fell down 8-9 interior stairs this am; wet shoe slipped; was carrying baby in car seat at the time; reports she is concerned b/c of hx of ectopic. Feels rectal pain 10/10 when sitting; also reports lower abdominal and lower back pain.

## 2012-07-13 NOTE — MAU Provider Note (Signed)
Chart reviewed and agree with management and plan.  

## 2012-07-13 NOTE — MAU Provider Note (Signed)
History     CSN: [id number]  Arrival date and time: 07/13/12 6213   First Provider Initiated Contact with Patient 07/13/12 [phone]      Chief Complaint  Patient presents with  . Fall  . Possible Pregnancy   HPI Katie Malone 36 y.o. [redacted]w[redacted]d   Client was carrying her child down the outside steps today and they were wet. She slipped and sat down and went down several steps.  Is having lower abdominal pain and back pain now that she was not having before she fell.  Was diagnosed with a positive pregnancy test at the ER.  Is very worried as she had an ectopic pregnancy in March.  Was not planning to be pregnant.  OB History    Grav Para Term Preterm Abortions TAB SAB Ect Mult Living   10 6 6  3 1 1 1  6       Past Medical History  Diagnosis Date  . Seizures     Last seizure March 2013    Past Surgical History  Procedure Date  . Wisdom tooth extraction   . Laparoscopy 11/05/2011    Procedure: LAPAROSCOPY OPERATIVE;  Surgeon: Jerene Bears, MD;  Location: [facility];  Service: Gynecology;  Laterality: N/A;  Right Salpingectomy  . Laparoscopic salpingo oopherectomy     Right     History reviewed. No pertinent family history.  History  Substance Use Topics  . Smoking status: Current Every Day Smoker -- 1.0 packs/day    Types: Cigarettes  . Smokeless tobacco: Not on file  . Alcohol Use: No    Allergies: No Known Allergies  Prescriptions prior to admission  Medication Sig Dispense Refill  . acetaminophen (TYLENOL) 325 MG tablet Take 650 mg by mouth once.        Review of Systems  Constitutional: Negative for fever.  Gastrointestinal: Positive for abdominal pain. Negative for nausea, vomiting, diarrhea and constipation.  Genitourinary:       No vaginal discharge. No vaginal bleeding. No dysuria.  Musculoskeletal: Positive for back pain.   Physical Exam   Blood pressure 119/78, pulse 111, temperature 98.9 F (37.2 C), temperature source Oral, resp. rate 16, height 5\' 4"   (1.626 m), weight 62.415 kg (137 lb 9.6 oz), last menstrual period 06/03/2012, SpO2 100.00%.  Physical Exam  Nursing note and vitals reviewed. Constitutional: She is oriented to person, place, and time. She appears well-developed and well-nourished.  HENT:  Head: Normocephalic.  Eyes: EOM are normal.  Neck: Neck supple.  GI: Soft. Bowel sounds are normal. There is tenderness. There is no rebound and no guarding.       Pain all across lower abdomen with palpation  Genitourinary:       Speculum exam: Vagina - Minimal creamy discharge, no odor Cervix - No contact bleeding Bimanual exam: Cervix closed Uterus non tender, 6 week size Adnexa tender bilaterally, no masses  GC/Chlam, wet prep done Chaperone present for exam.  Musculoskeletal: Normal range of motion.  Neurological: She is alert and oriented to person, place, and time.  Skin: Skin is warm and dry.  Psychiatric: She has a normal mood and affect.    MAU Course  Procedures Results for orders placed during the hospital encounter of 07/13/12 (from the past 24 hour(s))  URINALYSIS, ROUTINE W REFLEX MICROSCOPIC     Status: Abnormal   Collection Time   07/13/12  8:25 AM      Component Value Range   Color, Urine YELLOW  YELLOW  APPearance CLEAR  CLEAR   Specific Gravity, Urine 1.020  1.005 - 1.030   pH 6.5  5.0 - 8.0   Glucose, UA NEGATIVE  NEGATIVE mg/dL   Hgb urine dipstick MODERATE (*) NEGATIVE   Bilirubin Urine NEGATIVE  NEGATIVE   Ketones, ur 15 (*) NEGATIVE mg/dL   Protein, ur NEGATIVE  NEGATIVE mg/dL   Urobilinogen, UA 1.0  0.0 - 1.0 mg/dL   Nitrite NEGATIVE  NEGATIVE   Leukocytes, UA NEGATIVE  NEGATIVE  URINE MICROSCOPIC-ADD ON     Status: Abnormal   Collection Time   07/13/12  8:25 AM      Component Value Range   Squamous Epithelial / LPF FEW (*) RARE   RBC / HPF 0-2  <3 RBC/hpf   Urine-Other MUCOUS PRESENT    POCT PREGNANCY, URINE     Status: Abnormal   Collection Time   07/13/12  8:33 AM      Component  Value Range   Preg Test, Ur POSITIVE (*) NEGATIVE  WET PREP, GENITAL     Status: Abnormal   Collection Time   07/13/12  9:36 AM      Component Value Range   Yeast Wet Prep HPF POC NONE SEEN  NONE SEEN   Trich, Wet Prep NONE SEEN  NONE SEEN   Clue Cells Wet Prep HPF POC FEW (*) NONE SEEN   WBC, Wet Prep HPF POC FEW (*) NONE SEEN  CBC     Status: Abnormal   Collection Time   07/13/12  9:45 AM      Component Value Range   WBC 9.3  4.0 - 10.5 K/uL   RBC 3.74 (*) 3.87 - 5.11 MIL/uL   Hemoglobin 11.6 (*) 12.0 - 15.0 g/dL   HCT 16.1 (*) 09.6 - 04.5 %   MCV 88.5  78.0 - 100.0 fL   MCH 31.0  26.0 - 34.0 pg   MCHC 35.0  30.0 - 36.0 g/dL   RDW 40.9  81.1 - 91.4 %   Platelets 265  150 - 400 K/uL  HCG, QUANTITATIVE, PREGNANCY     Status: Abnormal   Collection Time   07/13/12  9:45 AM      Component Value Range   hCG, Beta Chain, Quant, S 410 (*) <5 mIU/mL   MDM Ultrasound result reviewed - No gestational sac, no yolk sac, no embryo  Assessment and Plan  Fall in pregnancy Abdominal pain  Back pain  Plan Return on Friday morning for repeat labs Pelvic rest Ectopic precautions given Return sooner with increasing pain or bleeding. Take Tylenol 325 mg 2 tablets by mouth every 4 hours if needed for pain.   BURLESON,TERRI 07/13/2012, 10:18 AM

## 2012-07-13 NOTE — MAU Note (Signed)
Patient states she slipped and slid on her buttocks down about 8-9 stairs while holding a 36 year old in a car seat. Now having abdominal pain, back and buttocks pain. Patient states she had a positive home pregnancy test about 1 week ago.

## 2012-08-17 NOTE — L&D Delivery Note (Signed)
Attestation of Attending Supervision of Advanced Practitioner (CNM/NP): Evaluation and management procedures were performed by the Advanced Practitioner under my supervision and collaboration.  I have reviewed the Advanced Practitioner's note and chart, and I agree with the management and plan.  Essence Merle 03/09/2013 8:07 PM   

## 2012-08-17 NOTE — L&D Delivery Note (Signed)
Delivery Note At 8:46 AM a viable female was delivered via  (Presentation: ;  ).  APGAR:9 ,9 ; weight .   Placenta status:spont, via [facility]'s , .  Cord:3 vc  with the following complications:none .  Cord pH: n/a  Anesthesia:  epidural Episiotomy:none  Lacerations:none  Suture Repair: n/a Est. Blood Loss (mL):   Mom to postpartum.  Baby to nursery-stable.  Wyvonnia Dusky DARLENE 03/05/2013, 8:56 AM

## 2012-10-19 ENCOUNTER — Other Ambulatory Visit (HOSPITAL_COMMUNITY): Payer: Self-pay | Admitting: Physician Assistant

## 2012-10-19 DIAGNOSIS — Z3689 Encounter for other specified antenatal screening: Secondary | ICD-10-CM

## 2012-10-19 LAB — OB RESULTS CONSOLE RUBELLA ANTIBODY, IGM: Rubella: IMMUNE

## 2012-10-19 LAB — OB RESULTS CONSOLE RPR: RPR: NONREACTIVE

## 2012-10-19 LAB — OB RESULTS CONSOLE HGB/HCT, BLOOD
HCT: 30 %
Hemoglobin: 10.8 g/dL

## 2012-10-19 LAB — OB RESULTS CONSOLE HIV ANTIBODY (ROUTINE TESTING): HIV: NONREACTIVE

## 2012-10-20 LAB — OB RESULTS CONSOLE HEPATITIS B SURFACE ANTIGEN: Hepatitis B Surface Ag: NEGATIVE

## 2012-10-24 ENCOUNTER — Ambulatory Visit (HOSPITAL_COMMUNITY)
Admission: RE | Admit: 2012-10-24 | Discharge: 2012-10-24 | Disposition: A | Discharge status: Home / Self Care | Payer: Medicaid Other | Source: Ambulatory Visit | Attending: Physician Assistant | Admitting: Physician Assistant

## 2012-10-24 DIAGNOSIS — Z363 Encounter for antenatal screening for malformations: Secondary | ICD-10-CM | POA: Insufficient documentation

## 2012-10-24 DIAGNOSIS — O358XX0 Maternal care for other (suspected) fetal abnormality and damage, not applicable or unspecified: Secondary | ICD-10-CM | POA: Insufficient documentation

## 2012-10-24 DIAGNOSIS — Z1389 Encounter for screening for other disorder: Secondary | ICD-10-CM | POA: Insufficient documentation

## 2012-10-24 DIAGNOSIS — Z8751 Personal history of pre-term labor: Secondary | ICD-10-CM | POA: Insufficient documentation

## 2012-10-24 DIAGNOSIS — Z3689 Encounter for other specified antenatal screening: Secondary | ICD-10-CM

## 2012-10-27 ENCOUNTER — Encounter: Payer: Self-pay | Admitting: *Deleted

## 2012-10-31 ENCOUNTER — Encounter: Payer: Self-pay | Admitting: *Deleted

## 2012-10-31 ENCOUNTER — Ambulatory Visit (INDEPENDENT_AMBULATORY_CARE_PROVIDER_SITE_OTHER): Payer: Medicaid Other | Admitting: Obstetrics & Gynecology

## 2012-10-31 ENCOUNTER — Encounter: Payer: Self-pay | Admitting: Obstetrics & Gynecology

## 2012-10-31 ENCOUNTER — Other Ambulatory Visit: Payer: Self-pay | Admitting: Obstetrics & Gynecology

## 2012-10-31 VITALS — BP 96/64 | Wt 145.8 lb

## 2012-10-31 DIAGNOSIS — O360121 Maternal care for anti-D [Rh] antibodies, second trimester, fetus 1: Secondary | ICD-10-CM

## 2012-10-31 DIAGNOSIS — G40909 Epilepsy, unspecified, not intractable, without status epilepticus: Secondary | ICD-10-CM

## 2012-10-31 DIAGNOSIS — Z6791 Unspecified blood type, Rh negative: Secondary | ICD-10-CM | POA: Insufficient documentation

## 2012-10-31 DIAGNOSIS — Z8759 Personal history of other complications of pregnancy, childbirth and the puerperium: Secondary | ICD-10-CM

## 2012-10-31 DIAGNOSIS — O0930 Supervision of pregnancy with insufficient antenatal care, unspecified trimester: Secondary | ICD-10-CM

## 2012-10-31 DIAGNOSIS — O09522 Supervision of elderly multigravida, second trimester: Secondary | ICD-10-CM

## 2012-10-31 DIAGNOSIS — O99350 Diseases of the nervous system complicating pregnancy, unspecified trimester: Secondary | ICD-10-CM | POA: Insufficient documentation

## 2012-10-31 DIAGNOSIS — O09529 Supervision of elderly multigravida, unspecified trimester: Secondary | ICD-10-CM

## 2012-10-31 DIAGNOSIS — O360990 Maternal care for other rhesus isoimmunization, unspecified trimester, not applicable or unspecified: Secondary | ICD-10-CM

## 2012-10-31 DIAGNOSIS — O26899 Other specified pregnancy related conditions, unspecified trimester: Secondary | ICD-10-CM | POA: Insufficient documentation

## 2012-10-31 DIAGNOSIS — O280 Abnormal hematological finding on antenatal screening of mother: Secondary | ICD-10-CM

## 2012-10-31 DIAGNOSIS — O09219 Supervision of pregnancy with history of pre-term labor, unspecified trimester: Secondary | ICD-10-CM | POA: Insufficient documentation

## 2012-10-31 DIAGNOSIS — O99332 Smoking (tobacco) complicating pregnancy, second trimester: Secondary | ICD-10-CM

## 2012-10-31 DIAGNOSIS — O09292 Supervision of pregnancy with other poor reproductive or obstetric history, second trimester: Secondary | ICD-10-CM

## 2012-10-31 DIAGNOSIS — O9989 Other specified diseases and conditions complicating pregnancy, childbirth and the puerperium: Secondary | ICD-10-CM

## 2012-10-31 DIAGNOSIS — Z8742 Personal history of other diseases of the female genital tract: Secondary | ICD-10-CM

## 2012-10-31 DIAGNOSIS — Z302 Encounter for sterilization: Secondary | ICD-10-CM

## 2012-10-31 DIAGNOSIS — O99330 Smoking (tobacco) complicating pregnancy, unspecified trimester: Secondary | ICD-10-CM | POA: Insufficient documentation

## 2012-10-31 DIAGNOSIS — O289 Unspecified abnormal findings on antenatal screening of mother: Secondary | ICD-10-CM

## 2012-10-31 DIAGNOSIS — IMO0002 Reserved for concepts with insufficient information to code with codable children: Secondary | ICD-10-CM | POA: Insufficient documentation

## 2012-10-31 DIAGNOSIS — O09299 Supervision of pregnancy with other poor reproductive or obstetric history, unspecified trimester: Secondary | ICD-10-CM

## 2012-10-31 DIAGNOSIS — O99352 Diseases of the nervous system complicating pregnancy, second trimester: Secondary | ICD-10-CM

## 2012-10-31 DIAGNOSIS — O0932 Supervision of pregnancy with insufficient antenatal care, second trimester: Secondary | ICD-10-CM

## 2012-10-31 DIAGNOSIS — O09212 Supervision of pregnancy with history of pre-term labor, second trimester: Secondary | ICD-10-CM

## 2012-10-31 DIAGNOSIS — O26892 Other specified pregnancy related conditions, second trimester: Secondary | ICD-10-CM

## 2012-10-31 LAB — POCT URINALYSIS DIP (DEVICE)
Ketones, ur: NEGATIVE mg/dL
Protein, ur: NEGATIVE mg/dL
Specific Gravity, Urine: 1.02 (ref 1.005–1.030)
pH: 6.5 (ref 5.0–8.0)

## 2012-10-31 NOTE — Progress Notes (Signed)
Pulse: 108

## 2012-10-31 NOTE — Patient Instructions (Signed)
17alpha-hydroxyprogesterone caproate (17P) recommended  Return to clinic for any obstetric concerns or go to MAU for evaluation

## 2012-10-31 NOTE — Progress Notes (Signed)
Initial visit here at [facility]; multiple issues including history of preterm delivery.  Problem list populated; no reason for any fetal monitoring as of now.  Offered 17-P injections, explained benefits, patient is undecided if she wants this.  Patient had quad screen showing 1:185 DSR and she will be 37 years old at the time of delivery ; she was referred to [facility] for genetic counseling, possible Harmony testing.  She smokes less than 1/2 ppd of cigarettes, she was congratulated on cutting down and encouraged to continue to do so. She desires sterilization, will sign papers at 28 weeks; will likely need a left salpingectomy (already had right salpingectomy  2/2 ectopic pregnancy).  Patient has a history of a seizure during her ectopic pregnancy in 2013, and current migraines with aura; referral to Neurology will be arranged.  Obstetric precautions reviewed.

## 2012-10-31 NOTE — Progress Notes (Signed)
MFM appointment scheduled for 11/01/12 at 10am. Neurology Referral faxed to office.

## 2012-11-01 ENCOUNTER — Encounter (HOSPITAL_COMMUNITY): Payer: Medicaid Other

## 2012-11-01 DIAGNOSIS — O0930 Supervision of pregnancy with insufficient antenatal care, unspecified trimester: Secondary | ICD-10-CM

## 2012-11-01 DIAGNOSIS — O99330 Smoking (tobacco) complicating pregnancy, unspecified trimester: Secondary | ICD-10-CM

## 2012-11-01 DIAGNOSIS — Z302 Encounter for sterilization: Secondary | ICD-10-CM

## 2012-11-01 DIAGNOSIS — G40909 Epilepsy, unspecified, not intractable, without status epilepticus: Secondary | ICD-10-CM

## 2012-11-01 DIAGNOSIS — O09219 Supervision of pregnancy with history of pre-term labor, unspecified trimester: Secondary | ICD-10-CM

## 2012-11-01 DIAGNOSIS — O280 Abnormal hematological finding on antenatal screening of mother: Secondary | ICD-10-CM

## 2012-11-01 DIAGNOSIS — Z8759 Personal history of other complications of pregnancy, childbirth and the puerperium: Secondary | ICD-10-CM

## 2012-11-01 DIAGNOSIS — O09299 Supervision of pregnancy with other poor reproductive or obstetric history, unspecified trimester: Secondary | ICD-10-CM

## 2012-11-01 DIAGNOSIS — O09529 Supervision of elderly multigravida, unspecified trimester: Secondary | ICD-10-CM

## 2012-11-01 DIAGNOSIS — O26899 Other specified pregnancy related conditions, unspecified trimester: Secondary | ICD-10-CM

## 2012-11-03 ENCOUNTER — Ambulatory Visit (HOSPITAL_COMMUNITY)
Admission: RE | Admit: 2012-11-03 | Discharge: 2012-11-03 | Disposition: A | Discharge status: Home / Self Care | Payer: Medicaid Other | Source: Ambulatory Visit | Attending: Obstetrics & Gynecology | Admitting: Obstetrics & Gynecology

## 2012-11-03 NOTE — Progress Notes (Addendum)
Genetic Counseling  High-Risk Gestation Note  Appointment Date:  11/03/2012 Referred By: Tereso Newcomer, MD Date of Birth:  06/30/1976    Pregnancy History: [id number] Estimated Date of Delivery: 03/09/13 Estimated Gestational Age: [redacted]w[redacted]d Attending: Particia Nearing, MD    [name] was seen for genetic counseling regarding a maternal age of 37 years old at delivery and an increased risk for Down syndrome based on Quad screen performed through [facility].  She was counseled regarding maternal age and the association with risk for chromosome conditions due to nondisjunction with aging of the ova.   We reviewed chromosomes, nondisjunction, and the associated 1 in 141 risk for fetal aneuploidy at [redacted]w[redacted]d gestation related to a maternal age of 37 y.o. at delivery. The risk for aneuploidy decreases as gestational age increases, accounting for those pregnancies which spontaneously abort.  We specifically discussed Down syndrome (trisomy 21), trisomies 13 and 44, including the common features and prognoses of each.   We reviewed Katie Malone' maternal serum Quad screen result and the associated increase in risk for fetal Down syndrome (1 in 418 to 1 in 185).  She was counseled regarding other explanations for a screen positive result including normal variation and differences in maternal metabolism.  In addition, we reviewed the screen adjusted reduction in risks for trisomy 18 (1 in 1,257 to 1 in 6,326) and ONTDs.  She understands that Quad screening provides a pregnancy specific risk for Down syndrome, but is not considered to be diagnostic.    We reviewed other available screening options including noninvasive prenatal testing (NIPT) and detailed ultrasound.  Specifically, we discussed that NIPT analyzes cell free fetal DNA found in the maternal circulation. This test is not diagnostic for chromosome conditions, but can provide information regarding the presence or absence of  extra fetal DNA for chromosomes 13, 18, 21, X, and Y, and missing fetal DNA for chromosome X and Y (Turner syndrome). Thus, it would not identify or rule out all genetic conditions. The reported detection rate is greater than 99% for Trisomy 21, greater than 98% for Trisomy 18, and is approximately 80% (8 out of 10) for Trisomy 13. The false positive rate is reported to be less than 0.1% for any of these conditions.  In addition, we discussed that ~50-80% of fetuses with Down syndrome and up to 90-95% of fetuses with trisomy 18/13, when well visualized, have detectable anomalies or soft markers by detailed ultrasound (~18+ weeks gestation). [name] had ultrasound performed through [facility] on 10/24/12. We reviewed that the results of that ultrasound reported normal visualized fetal anatomy and reported no visualized markers of aneuploidy. She understands that ultrasound cannot diagnose or rule out all birth defects or genetic conditions prenatally.  [name] was also counseled regarding diagnostic testing via amniocentesis.  We reviewed the approximate 1 in 300-500 risk for complications, including spontaneous pregnancy loss or preterm labor. After consideration of all the options, [name] declined amniocentesis and NIPT in the pregnancy.  She stated that she is comfortable with the current risk assessment for Down syndrome in the pregnancy and would not alter the pregnancy course if Down syndrome were to be present. [name] stated that she and the father of the pregnancy would prefer to wait for postnatal evaluation for Down syndrome, if it is indicated at that time.  Both family histories were reviewed and found to be contributory for cleft lip and palate in  the patient's first son, which was with a different partner. He is currently 56 years old, was diagnosed prenatally with unilateral cleft lip and palate. She reported that this was surgically repaired  postnatally at [facility] by Dr. Dawna Part. She reported that her son was recently diagnosed with autism. The patient reported that her son's father, who is not related to the current pregnancy, has relatives with cleft palate and with autism. We discussed that cleft lip +/- cleft palate can be syndromic or isolated.  If this individual has a syndromic form of clefting, the chance of having an affected child depends on the inheritance pattern of that condition.  If the patient's son has an isolated form of clefting, we discussed the probable multifactorial inheritance and explained that genetic testing for isolated cleft lip +/- cleft palate is not currently available.  Based on the family history, the recurrence risk for a second degree relative would be approximately 0.6%. We discussed that targeted ultrasound is available to assess for facial clefts in pregnancy. However, ultrasound cannot diagnose or rule out all birth defects.   We discussed that autism is part of the spectrum of conditions referred to as Autistic spectrum disorders (ASD). We discussed that ASDs are among the most common neurodevelopmental disorders, with approximately 1 in 88 children meeting criteria for ASD. Approximately 80% of individuals diagnosed are female. There is strong evidence that genetic factors play a critical role in development of ASD. There have been recent advances in identifying specific genetic causes of ASD, however, there are still many individuals for whom the etiology of the ASD is not known. Once a family has a child with a diagnosis of ASD, there is a 13.5% chance to have another child with ASD. If the pregnancy is female the chance is approximately 9%, and approximately 26% if the pregnancy is female. Data are limited regarding recurrence risk estimate for second degree relatives, in the absence of an identified cause. They understand that at this time there is not genetic testing available for ASD for  most families.  [name] also reported a maternal aunt with a history of 9 pregnancy losses and no children. The patient did have information regarding whether or not an underlying cause is known for the miscarriages. Approximately 1 in 6 confirmed pregnancies results in miscarriage. A single underlying cause is more likely to be suspected when a couple has experienced 3 or more losses. We reviewed the multiple etiologies for recurrent pregnancy losses including: maternal disorders (thyroid disease, diabetes, lupus, etc), anatomical differences (incompetent cervix, abnormal uterine position or shape), environmental causes (nutrition, drugs, alcohol, infections), and genetic causes (chromosome differences, inherited clotting disorders). We discussed that a small percentage of individuals with recurrent pregnancy loss would have an etiology that could have implications for relatives. Without further information regarding the provided family history, an accurate genetic risk cannot be calculated. Further genetic counseling is warranted if more information is obtained.  Ms. Pontillo denied exposure to environmental toxins or chemical agents. She denied the use of alcohol or street drugs. She reported smoking approximately 4 cigarettes per day and continues to work on cutting back. The associations of smoking in pregnancy were reviewed and cessation encouraged. She denied significant viral illnesses during the course of her pregnancy. Her medical and surgical histories were noncontributory.     I counseled Katie Malone regarding the above risks and available options.  The approximate face-to-face time with the genetic counselor was 35 minutes.  Quinn Plowman, MS,  Certified Genetic Counselor 11/03/2012

## 2012-11-07 ENCOUNTER — Encounter: Payer: Self-pay | Admitting: *Deleted

## 2012-11-21 ENCOUNTER — Ambulatory Visit (INDEPENDENT_AMBULATORY_CARE_PROVIDER_SITE_OTHER): Payer: Medicaid Other | Admitting: Advanced Practice Midwife

## 2012-11-21 ENCOUNTER — Encounter: Payer: Self-pay | Admitting: Advanced Practice Midwife

## 2012-11-21 VITALS — BP 99/64 | Temp 98.0°F | Wt 147.0 lb

## 2012-11-21 DIAGNOSIS — O09523 Supervision of elderly multigravida, third trimester: Secondary | ICD-10-CM

## 2012-11-21 DIAGNOSIS — O09299 Supervision of pregnancy with other poor reproductive or obstetric history, unspecified trimester: Secondary | ICD-10-CM

## 2012-11-21 DIAGNOSIS — O09529 Supervision of elderly multigravida, unspecified trimester: Secondary | ICD-10-CM

## 2012-11-21 DIAGNOSIS — O09292 Supervision of pregnancy with other poor reproductive or obstetric history, second trimester: Secondary | ICD-10-CM

## 2012-11-21 DIAGNOSIS — O09213 Supervision of pregnancy with history of pre-term labor, third trimester: Secondary | ICD-10-CM

## 2012-11-21 LAB — POCT URINALYSIS DIP (DEVICE)
Bilirubin Urine: NEGATIVE
Ketones, ur: NEGATIVE mg/dL
Leukocytes, UA: NEGATIVE
Specific Gravity, Urine: >=1.03 (ref 1.005–1.030)
pH: 6.5 (ref 5.0–8.0)

## 2012-11-21 MED ORDER — BUTALBITAL-APAP-CAFFEINE 50-500-40 MG PO TABS: 1.0000 | ORAL_TABLET | ORAL | Status: DC | PRN

## 2012-11-21 NOTE — Progress Notes (Deleted)
Hip pain Migraines - needs neuro f/u Heartburn Nutrition

## 2012-11-21 NOTE — Progress Notes (Signed)
C/O worsening migraines - flexeril doesn't really helps, just makes her feel tired and can't take while at work. Has not heard from neuro yet for consult. Will rx fioricet today, to see check out to try to schedule with neuro. Pt also c/o right hip pain that radiates down right leg, no loss of sensation or function - rev'd comfort measures, stretches. Also c/o heartburn - declines meds, states that eating a spoonful of mustard helps. Rev'd precautions, return in 4 weeks, 1 hour GCT then.

## 2012-11-21 NOTE — Progress Notes (Signed)
Pulse  92 C/o worsening migraines and right hip that radiates down to right leg.

## 2012-11-21 NOTE — Progress Notes (Signed)
[facility] called and ML with referral coordinator to fax a referral form so this patient can be set up for an appointment. Patient given contact information for this practice.

## 2012-11-21 NOTE — Addendum Note (Signed)
Addended by: Franchot Mimes on: 11/21/2012 02:31 PM   Modules accepted: Orders

## 2012-11-24 LAB — CULTURE, OB URINE

## 2012-11-28 ENCOUNTER — Other Ambulatory Visit: Payer: Self-pay | Admitting: Advanced Practice Midwife

## 2012-11-28 DIAGNOSIS — O2342 Unspecified infection of urinary tract in pregnancy, second trimester: Secondary | ICD-10-CM

## 2012-11-28 DIAGNOSIS — R8271 Bacteriuria: Secondary | ICD-10-CM | POA: Insufficient documentation

## 2012-11-28 MED ORDER — SULFAMETHOXAZOLE-TRIMETHOPRIM 800-160 MG PO TABS: 1.0000 | ORAL_TABLET | Freq: Two times a day (BID) | ORAL | Status: DC

## 2012-11-28 MED ORDER — NITROFURANTOIN MONOHYD MACRO 100 MG PO CAPS: 100.0000 mg | ORAL_CAPSULE | Freq: Two times a day (BID) | ORAL | Status: AC

## 2012-11-29 NOTE — Progress Notes (Signed)
Called pt and informed her of +UTI which requires treatment with 2 antibiotics. She may pick them up from her pharmacy today. Pt voiced understanding.

## 2012-12-19 ENCOUNTER — Encounter: Payer: Medicaid Other | Admitting: Advanced Practice Midwife

## 2012-12-26 ENCOUNTER — Other Ambulatory Visit: Payer: Self-pay | Admitting: Family

## 2012-12-26 ENCOUNTER — Ambulatory Visit (INDEPENDENT_AMBULATORY_CARE_PROVIDER_SITE_OTHER): Payer: Medicaid Other | Admitting: Family

## 2012-12-26 VITALS — BP 112/67 | Temp 98.7°F | Wt 155.0 lb

## 2012-12-26 DIAGNOSIS — O280 Abnormal hematological finding on antenatal screening of mother: Secondary | ICD-10-CM

## 2012-12-26 DIAGNOSIS — O09529 Supervision of elderly multigravida, unspecified trimester: Secondary | ICD-10-CM

## 2012-12-26 DIAGNOSIS — O289 Unspecified abnormal findings on antenatal screening of mother: Secondary | ICD-10-CM

## 2012-12-26 LAB — POCT URINALYSIS DIP (DEVICE)
Ketones, ur: NEGATIVE mg/dL
Leukocytes, UA: NEGATIVE
Protein, ur: NEGATIVE mg/dL

## 2012-12-26 NOTE — Progress Notes (Signed)
No questions or concerns; reviewed quad results and pt decision to not do amniocentesis.  Declines rhogam today, will come in one week for 1 hr/labs/rhogam.

## 2012-12-26 NOTE — Progress Notes (Signed)
Pulse- 94  Pt will need to schedule for 28 week labs

## 2013-01-02 ENCOUNTER — Other Ambulatory Visit: Payer: Medicaid Other

## 2013-01-03 ENCOUNTER — Other Ambulatory Visit: Payer: Medicaid Other

## 2013-01-03 DIAGNOSIS — O0933 Supervision of pregnancy with insufficient antenatal care, third trimester: Secondary | ICD-10-CM

## 2013-01-03 DIAGNOSIS — O09299 Supervision of pregnancy with other poor reproductive or obstetric history, unspecified trimester: Secondary | ICD-10-CM

## 2013-01-03 DIAGNOSIS — O99013 Anemia complicating pregnancy, third trimester: Secondary | ICD-10-CM

## 2013-01-03 LAB — CBC
Hemoglobin: 9.3 g/dL — ABNORMAL LOW (ref 12.0–15.0)
MCHC: 35.1 g/dL (ref 30.0–36.0)
RDW: 12.7 % (ref 11.5–15.5)

## 2013-01-03 MED ORDER — RHO D IMMUNE GLOBULIN 1500 UNIT/2ML IJ SOLN
300.0000 ug | Freq: Once | INTRAMUSCULAR | Status: AC
  Administered 2013-01-03: 300 ug via INTRAMUSCULAR

## 2013-01-04 DIAGNOSIS — O99019 Anemia complicating pregnancy, unspecified trimester: Secondary | ICD-10-CM | POA: Insufficient documentation

## 2013-01-04 LAB — GLUCOSE TOLERANCE, 1 HOUR (50G) W/O FASTING: Glucose, 1 Hour GTT: 101 mg/dL (ref 70–140)

## 2013-01-04 LAB — RPR

## 2013-01-04 LAB — HIV ANTIBODY (ROUTINE TESTING W REFLEX): HIV: NONREACTIVE

## 2013-01-05 ENCOUNTER — Telehealth: Payer: Self-pay | Admitting: *Deleted

## 2013-01-05 NOTE — Telephone Encounter (Signed)
Message copied by Mannie Stabile on Thu Jan 05, 2013  4:46 PM ------      Message from: Willodean Rosenthal      Created: Wed Jan 04, 2013 10:08 AM       Please call pt.  She is anemia.  Hct 26% Please ask her to begin FeSO4 twice a day (OTC).            Thanks            clh-S    ------

## 2013-01-05 NOTE — Telephone Encounter (Signed)
Pt informed

## 2013-01-16 ENCOUNTER — Ambulatory Visit (INDEPENDENT_AMBULATORY_CARE_PROVIDER_SITE_OTHER): Payer: Medicaid Other | Admitting: Family

## 2013-01-16 VITALS — BP 106/72 | Wt 157.2 lb

## 2013-01-16 DIAGNOSIS — O0930 Supervision of pregnancy with insufficient antenatal care, unspecified trimester: Secondary | ICD-10-CM

## 2013-01-16 DIAGNOSIS — O360990 Maternal care for other rhesus isoimmunization, unspecified trimester, not applicable or unspecified: Secondary | ICD-10-CM

## 2013-01-16 DIAGNOSIS — O360130 Maternal care for anti-D [Rh] antibodies, third trimester, not applicable or unspecified: Secondary | ICD-10-CM

## 2013-01-16 DIAGNOSIS — O99013 Anemia complicating pregnancy, third trimester: Secondary | ICD-10-CM

## 2013-01-16 DIAGNOSIS — O99019 Anemia complicating pregnancy, unspecified trimester: Secondary | ICD-10-CM

## 2013-01-16 DIAGNOSIS — O0933 Supervision of pregnancy with insufficient antenatal care, third trimester: Secondary | ICD-10-CM

## 2013-01-16 LAB — POCT URINALYSIS DIP (DEVICE)
Bilirubin Urine: NEGATIVE
Leukocytes, UA: NEGATIVE
Nitrite: NEGATIVE
Protein, ur: NEGATIVE mg/dL
Urobilinogen, UA: 0.2 mg/dL (ref 0.0–1.0)
pH: 7 (ref 5.0–8.0)

## 2013-01-16 NOTE — Progress Notes (Signed)
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

## 2013-01-16 NOTE — Progress Notes (Signed)
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 She needs a note allowing her to change the tasks she does at work.

## 2013-01-30 ENCOUNTER — Ambulatory Visit (INDEPENDENT_AMBULATORY_CARE_PROVIDER_SITE_OTHER): Payer: Medicaid Other | Admitting: Family

## 2013-01-30 ENCOUNTER — Encounter: Payer: Self-pay | Admitting: Family

## 2013-01-30 VITALS — BP 103/63 | Wt 156.4 lb

## 2013-01-30 DIAGNOSIS — O0930 Supervision of pregnancy with insufficient antenatal care, unspecified trimester: Secondary | ICD-10-CM

## 2013-01-30 DIAGNOSIS — R6889 Other general symptoms and signs: Secondary | ICD-10-CM

## 2013-01-30 DIAGNOSIS — R8271 Bacteriuria: Secondary | ICD-10-CM

## 2013-01-30 DIAGNOSIS — IMO0002 Reserved for concepts with insufficient information to code with codable children: Secondary | ICD-10-CM | POA: Insufficient documentation

## 2013-01-30 DIAGNOSIS — O0933 Supervision of pregnancy with insufficient antenatal care, third trimester: Secondary | ICD-10-CM

## 2013-01-30 DIAGNOSIS — R82998 Other abnormal findings in urine: Secondary | ICD-10-CM

## 2013-01-30 MED ORDER — NITROFURANTOIN MONOHYD MACRO 100 MG PO CAPS: 100.0000 mg | ORAL_CAPSULE | Freq: Two times a day (BID) | ORAL | Status: DC

## 2013-01-30 NOTE — Progress Notes (Signed)
Reports increased pelvic pressure;  No report of bleeding or leaking of fluid.  Reviewed signs of labor.  Trace leuks and blood in urine.  Increased urinary frequency.  RX Macrobid and send urine culture.

## 2013-01-30 NOTE — Progress Notes (Signed)
Pulse 97 Problems with pain in her legs, bothers her a lot at night. Not getting much rest.

## 2013-02-07 ENCOUNTER — Encounter (HOSPITAL_COMMUNITY): Payer: Self-pay | Admitting: *Deleted

## 2013-02-07 ENCOUNTER — Inpatient Hospital Stay (HOSPITAL_COMMUNITY)
Admission: AD | Admit: 2013-02-07 | Discharge: 2013-02-07 | Disposition: A | Discharge status: Home / Self Care | Payer: Medicaid Other | Source: Ambulatory Visit | Attending: Obstetrics & Gynecology | Admitting: Obstetrics & Gynecology

## 2013-02-07 DIAGNOSIS — O0933 Supervision of pregnancy with insufficient antenatal care, third trimester: Secondary | ICD-10-CM

## 2013-02-07 DIAGNOSIS — O99891 Other specified diseases and conditions complicating pregnancy: Secondary | ICD-10-CM | POA: Insufficient documentation

## 2013-02-07 DIAGNOSIS — N390 Urinary tract infection, site not specified: Secondary | ICD-10-CM

## 2013-02-07 DIAGNOSIS — Z8759 Personal history of other complications of pregnancy, childbirth and the puerperium: Secondary | ICD-10-CM

## 2013-02-07 DIAGNOSIS — N949 Unspecified condition associated with female genital organs and menstrual cycle: Secondary | ICD-10-CM | POA: Insufficient documentation

## 2013-02-07 DIAGNOSIS — O360130 Maternal care for anti-D [Rh] antibodies, third trimester, not applicable or unspecified: Secondary | ICD-10-CM

## 2013-02-07 DIAGNOSIS — O2342 Unspecified infection of urinary tract in pregnancy, second trimester: Secondary | ICD-10-CM

## 2013-02-07 DIAGNOSIS — Z302 Encounter for sterilization: Secondary | ICD-10-CM

## 2013-02-07 DIAGNOSIS — O479 False labor, unspecified: Secondary | ICD-10-CM

## 2013-02-07 DIAGNOSIS — O09292 Supervision of pregnancy with other poor reproductive or obstetric history, second trimester: Secondary | ICD-10-CM

## 2013-02-07 DIAGNOSIS — O99013 Anemia complicating pregnancy, third trimester: Secondary | ICD-10-CM

## 2013-02-07 DIAGNOSIS — O280 Abnormal hematological finding on antenatal screening of mother: Secondary | ICD-10-CM

## 2013-02-07 DIAGNOSIS — O09213 Supervision of pregnancy with history of pre-term labor, third trimester: Secondary | ICD-10-CM

## 2013-02-07 DIAGNOSIS — O4700 False labor before 37 completed weeks of gestation, unspecified trimester: Secondary | ICD-10-CM | POA: Insufficient documentation

## 2013-02-07 DIAGNOSIS — IMO0002 Reserved for concepts with insufficient information to code with codable children: Secondary | ICD-10-CM

## 2013-02-07 DIAGNOSIS — O09523 Supervision of elderly multigravida, third trimester: Secondary | ICD-10-CM

## 2013-02-07 NOTE — MAU Provider Note (Signed)
  History     CSN: [id number]  Arrival date and time: 02/07/13 1924   None     No chief complaint on file.  HPI Katie Malone is a 37yo [id number] at 34.4wks who presents for eval of mucousy vaginal d/c earlier today 'like snot with a streak of blood'. Denies leaking fluid; reports irreg ctx. No N/V/D. Denies fever or dysuria.  Her preg has been followed by the [facility] and has been remarkable for 1) prev PTD x 2 2) increased DS risk of 1:185 3) AMA 4) hx migranes 5) ectopic with right salpingectomy 6) smoking 1/2 PPD  OB History   Grav Para Term Preterm Abortions TAB SAB Ect Mult Living   9 6 4 2 2 1  0 1  6      Past Medical History  Diagnosis Date  . Seizures     Last seizure March 2013  . Abnormal Pap smear     Past Surgical History  Procedure Laterality Date  . Wisdom tooth extraction    . Laparoscopy  11/05/2011    Procedure: LAPAROSCOPY OPERATIVE;  Surgeon: Jerene Bears, MD;  Location: [facility];  Service: Gynecology;  Laterality: N/A;  Right Salpingectomy  . Laparoscopic salpingo oopherectomy      Right     Family History  Problem Relation Age of Onset  . Heart disease Mother   . Hypertension Mother     History  Substance Use Topics  . Smoking status: Current Every Day Smoker -- 0.50 packs/day    Types: Cigarettes  . Smokeless tobacco: Not on file  . Alcohol Use: No    Allergies: No Known Allergies  Prescriptions prior to admission  Medication Sig Dispense Refill  . acetaminophen (TYLENOL) 325 MG tablet Take 650 mg by mouth once.      . butalbital-acetaminophen-caffeine (ESGIC PLUS) 50-500-40 MG per tablet Take 1 tablet by mouth every 4 (four) hours as needed for pain.  30 tablet  0  . nitrofurantoin, macrocrystal-monohydrate, (MACROBID) 100 MG capsule Take 1 capsule (100 mg total) by mouth 2 (two) times daily.  14 capsule  0  . Prenatal Vit-Fe Fumarate-FA (PRENATAL MULTIVITAMIN) TABS Take 1 tablet by mouth daily at 12 noon.        ROS Physical Exam   Blood  pressure 117/75, pulse 96, temperature 98.2 F (36.8 C), temperature source Oral, resp. rate 20, height 5\' 2"  (1.575 m), weight 68.266 kg (150 lb 8 oz), last menstrual period 06/02/2012.  Physical Exam  Constitutional: She is oriented to person, place, and time. She appears well-developed.  HENT:  Head: Normocephalic.  Neck: Normal range of motion.  Cardiovascular: Normal rate.   Respiratory: Effort normal.  GI:  FHR 130s +accels, no decels Ctx irreg q 4-8 mins, mild  Genitourinary: Vagina normal.  SSE: sm white vag d/c, neg pool, neg fern Cx 1/thick/vtx  Musculoskeletal: Normal range of motion.  Neurological: She is alert and oriented to person, place, and time.  Skin: Skin is warm and dry.  Psychiatric: She has a normal mood and affect. Her behavior is normal. Thought content normal.    MAU Course  Procedures    Assessment and Plan  IUP at 34.4wks Vag discharge Braxton Hicks ctx  D/C home with preterm labor precautions F/U as scheduled at the [facility] 02/07/2013, 9:29 PM

## 2013-02-07 NOTE — MAU Note (Signed)
SAYS SHE HAS BEEN HURTING ALL DAY SINCE 930AM.  GOT UP  AT 4 PM- FELT PRESSURE AND BACK PAIN.  FEELS BABY MOVING     WENT TO B-ROOM  AT 630-  VOIDED THEN  HAD  THICK MUCUS - WITH BLOOD TINGE.        FEELS LIKE UC STRONGER SINCE LOST PLUG.   VE IN CLINIC- 2 CM-     6-16.   DENIES HSV AND MRSA.

## 2013-02-09 NOTE — MAU Provider Note (Signed)
Attestation of Attending Supervision of Advanced Practitioner (PA/CNM/NP): Evaluation and management procedures were performed by the Advanced Practitioner under my supervision and collaboration.  I have reviewed the Advanced Practitioner's note and chart, and I agree with the management and plan.  Breyson Kelm, MD, FACOG Attending Obstetrician & Gynecologist Faculty Practice, [facility]  

## 2013-02-13 ENCOUNTER — Ambulatory Visit (INDEPENDENT_AMBULATORY_CARE_PROVIDER_SITE_OTHER): Payer: Medicaid Other | Admitting: Family Medicine

## 2013-02-13 VITALS — BP 108/72 | Temp 98.1°F | Wt 157.0 lb

## 2013-02-13 DIAGNOSIS — O280 Abnormal hematological finding on antenatal screening of mother: Secondary | ICD-10-CM

## 2013-02-13 DIAGNOSIS — O0930 Supervision of pregnancy with insufficient antenatal care, unspecified trimester: Secondary | ICD-10-CM

## 2013-02-13 DIAGNOSIS — O09529 Supervision of elderly multigravida, unspecified trimester: Secondary | ICD-10-CM

## 2013-02-13 LAB — POCT URINALYSIS DIP (DEVICE)
Nitrite: NEGATIVE
Urobilinogen, UA: 0.2 mg/dL (ref 0.0–1.0)
pH: 7 (ref 5.0–8.0)

## 2013-02-13 NOTE — Progress Notes (Signed)
Cultures next week. Labor precautions given.

## 2013-02-13 NOTE — Patient Instructions (Signed)
Pregnancy - Third Trimester The third trimester of pregnancy (the last 3 months) is a period of the most rapid growth for you and your baby. The baby approaches a length of 20 inches and a weight of 6 to 10 pounds. The baby is adding on fat and getting ready for life outside your body. While inside, babies have periods of sleeping and waking, sucking thumbs, and hiccuping. You can often feel small contractions of the uterus. This is false labor. It is also called Braxton-Hicks contractions. This is like a practice for labor. The usual problems in this stage of pregnancy include more difficulty breathing, swelling of the hands and feet from water retention, and having to urinate more often because of the uterus and baby pressing on your bladder.  PRENATAL EXAMS  Blood work may continue to be done during prenatal exams. These tests are done to check on your health and the probable health of your baby. Blood work is used to follow your blood levels (hemoglobin). Anemia (low hemoglobin) is common during pregnancy. Iron and vitamins are given to help prevent this. You may also continue to be checked for diabetes. Some of the past blood tests may be done again.  The size of the uterus is measured during each visit. This makes sure your baby is growing properly according to your pregnancy dates.  Your blood pressure is checked every prenatal visit. This is to make sure you are not getting toxemia.  Your urine is checked every prenatal visit for infection, diabetes, and protein.  Your weight is checked at each visit. This is done to make sure gains are happening at the suggested rate and that you and your baby are growing normally.  Sometimes, an ultrasound is performed to confirm the position and the proper growth and development of the baby. This is a test done that bounces harmless sound waves off the baby so your caregiver can more accurately determine a due date.  Discuss the type of pain medicine and  anesthesia you will have during your labor and delivery.  Discuss the possibility and anesthesia if a cesarean section might be necessary.  Inform your caregiver if there is any mental or physical violence at home. Sometimes, a specialized non-stress test, contraction stress test, and biophysical profile are done to make sure the baby is not having a problem. Checking the amniotic fluid surrounding the baby is called an amniocentesis. The amniotic fluid is removed by sticking a needle into the belly (abdomen). This is sometimes done near the end of pregnancy if an early delivery is required. In this case, it is done to help make sure the baby's lungs are mature enough for the baby to live outside of the womb. If the lungs are not mature and it is unsafe to deliver the baby, an injection of cortisone medicine is given to the mother 1 to 2 days before the delivery. This helps the baby's lungs mature and makes it safer to deliver the baby. CHANGES OCCURING IN THE THIRD TRIMESTER OF PREGNANCY Your body goes through many changes during pregnancy. They vary from person to person. Talk to your caregiver about changes you notice and are concerned about.  During the last trimester, you have probably had an increase in your appetite. It is normal to have cravings for certain foods. This varies from person to person and pregnancy to pregnancy.  You may begin to get stretch marks on your hips, abdomen, and breasts. These are normal changes in the body   during pregnancy. There are no exercises or medicines to take which prevent this change.  Constipation may be treated with a stool softener or adding bulk to your diet. Drinking lots of fluids, fiber in vegetables, fruits, and whole grains are helpful.  Exercising is also helpful. If you have been very active up until your pregnancy, most of these activities can be continued during your pregnancy. If you have been less active, it is helpful to start an exercise  program such as walking. Consult your caregiver before starting exercise programs.  Avoid all smoking, alcohol, non-prescribed drugs, herbs and "street drugs" during your pregnancy. These chemicals affect the formation and growth of the baby. Avoid chemicals throughout the pregnancy to ensure the delivery of a healthy infant.  Backache, varicose veins, and hemorrhoids may develop or get worse.  You will tire more easily in the third trimester, which is normal.  The baby's movements may be stronger and more often.  You may become short of breath easily.  Your belly button may stick out.  A yellow discharge may leak from your breasts called colostrum.  You may have a bloody mucus discharge. This usually occurs a few days to a week before labor begins. HOME CARE INSTRUCTIONS   Keep your caregiver's appointments. Follow your caregiver's instructions regarding medicine use, exercise, and diet.  During pregnancy, you are providing food for you and your baby. Continue to eat regular, well-balanced meals. Choose foods such as meat, fish, milk and other low fat dairy products, vegetables, fruits, and whole-grain breads and cereals. Your caregiver will tell you of the ideal weight gain.  A physical sexual relationship may be continued throughout pregnancy if there are no other problems such as early (premature) leaking of amniotic fluid from the membranes, vaginal bleeding, or belly (abdominal) pain.  Exercise regularly if there are no restrictions. Check with your caregiver if you are unsure of the safety of your exercises. Greater weight gain will occur in the last 2 trimesters of pregnancy. Exercising helps:  Control your weight.  Get you in shape for labor and delivery.  You lose weight after you deliver.  Rest a lot with legs elevated, or as needed for leg cramps or low back pain.  Wear a good support or jogging bra for breast tenderness during pregnancy. This may help if worn during  sleep. Pads or tissues may be used in the bra if you are leaking colostrum.  Do not use hot tubs, steam rooms, or saunas.  Wear your seat belt when driving. This protects you and your baby if you are in an accident.  Avoid raw meat, cat litter boxes and soil used by cats. These carry germs that can cause birth defects in the baby.  It is easier to leak urine during pregnancy. Tightening up and strengthening the pelvic muscles will help with this problem. You can practice stopping your urination while you are going to the bathroom. These are the same muscles you need to strengthen. It is also the muscles you would use if you were trying to stop from passing gas. You can practice tightening these muscles up 10 times a set and repeating this about 3 times per day. Once you know what muscles to tighten up, do not perform these exercises during urination. It is more likely to cause an infection by backing up the urine.  Ask for help if you have financial, counseling, or nutritional needs during pregnancy. Your caregiver will be able to offer counseling for these   needs as well as refer you for other special needs.  Make a list of emergency phone numbers and have them available.  Plan on getting help from family or friends when you go home from the hospital.  Make a trial run to the hospital.  Take prenatal classes with the father to understand, practice, and ask questions about the labor and delivery.  Prepare the baby's room or nursery.  Do not travel out of the city unless it is absolutely necessary and with the advice of your caregiver.  Wear only low or no heal shoes to have better balance and prevent falling. MEDICINES AND DRUG USE IN PREGNANCY  Take prenatal vitamins as directed. The vitamin should contain 1 milligram of folic acid. Keep all vitamins out of reach of children. Only a couple vitamins or tablets containing iron may be fatal to a baby or young child when ingested.  Avoid use  of all medicines, including herbs, over-the-counter medicines, not prescribed or suggested by your caregiver. Only take over-the-counter or prescription medicines for pain, discomfort, or fever as directed by your caregiver. Do not use aspirin, ibuprofen or naproxen unless approved by your caregiver.  Let your caregiver also know about herbs you may be using.  Alcohol is related to a number of birth defects. This includes fetal alcohol syndrome. All alcohol, in any form, should be avoided completely. Smoking will cause low birth rate and premature babies.  Illegal drugs are very harmful to the baby. They are absolutely forbidden. A baby born to an addicted mother will be addicted at birth. The baby will go through the same withdrawal an adult does. SEEK MEDICAL CARE IF: You have any concerns or worries during your pregnancy. It is better to call with your questions if you feel they cannot wait, rather than worry about them. SEEK IMMEDIATE MEDICAL CARE IF:   An unexplained oral temperature above 102 F (38.9 C) develops, or as your caregiver suggests.  You have leaking of fluid from the vagina. If leaking membranes are suspected, take your temperature and tell your caregiver of this when you call.  There is vaginal spotting, bleeding or passing clots. Tell your caregiver of the amount and how many pads are used.  You develop a bad smelling vaginal discharge with a change in the color from clear to white.  You develop vomiting that lasts more than 24 hours.  You develop chills or fever.  You develop shortness of breath.  You develop burning on urination.  You loose more than 2 pounds of weight or gain more than 2 pounds of weight or as suggested by your caregiver.  You notice sudden swelling of your face, hands, and feet or legs.  You develop belly (abdominal) pain. Round ligament discomfort is a common non-cancerous (benign) cause of abdominal pain in pregnancy. Your caregiver still  must evaluate you.  You develop a severe headache that does not go away.  You develop visual problems, blurred or double vision.  If you have not felt your baby move for more than 1 hour. If you think the baby is not moving as much as usual, eat something with sugar in it and lie down on your left side for an hour. The baby should move at least 4 to 5 times per hour. Call right away if your baby moves less than that.  You fall, are in a car accident, or any kind of trauma.  There is mental or physical violence at home. Document Released: 07/28/2001   Document Revised: 04/27/2012 Document Reviewed: 01/30/2009 [employer] Patient Information 2014 [employer].  Breastfeeding A change in hormones during your pregnancy causes growth of your breast tissue and an increase in number and size of milk ducts. The hormone prolactin allows proteins, sugars, and fats from your blood supply to make breast milk in your milk-producing glands. The hormone progesterone prevents breast milk from being released before the birth of your baby. After the birth of your baby, your progesterone level decreases allowing breast milk to be released. Thoughts of your baby, as well as his or her sucking or crying, can stimulate the release of milk from the milk-producing glands. Deciding to breastfeed (nurse) is one of the best choices you can make for you and your baby. The information that follows gives a brief review of the benefits, as well as other important skills to know about breastfeeding. BENEFITS OF BREASTFEEDING For your baby  The first milk (colostrum) helps your baby's digestive system function better.   There are antibodies in your milk that help your baby fight off infections.   Your baby has a lower incidence of asthma, allergies, and sudden infant death syndrome (SIDS).   The nutrients in breast milk are better for your baby than infant formulas.  Breast milk improves your baby's brain development.    Your baby will have less gas, colic, and constipation.  Your baby is less likely to develop other conditions, such as childhood obesity, asthma, or diabetes mellitus. For you  Breastfeeding helps develop a very special bond between you and your baby.   Breastfeeding is convenient, always available at the correct temperature, and costs nothing.   Breastfeeding helps to burn calories and helps you lose the weight gained during pregnancy.   Breastfeeding makes your uterus contract back down to normal size faster and slows bleeding following delivery.   Breastfeeding mothers have a lower risk of developing osteoporosis or breast or ovarian cancer later in life.  BREASTFEEDING FREQUENCY  A healthy, full-term baby may breastfeed as often as every hour or space his or her feedings to every 3 hours. Breastfeeding frequency will vary from baby to baby.   Newborns should be fed no less than every 2 3 hours during the day and every 4 5 hours during the night. You should breastfeed a minimum of 8 feedings in a 24 hour period.  Awaken your baby to breastfeed if it has been 3 4 hours since the last feeding.  Breastfeed when you feel the need to reduce the fullness of your breasts or when your newborn shows signs of hunger. Signs that your baby may be hungry include:  Increased alertness or activity.  Stretching.  Movement of the head from side to side.  Movement of the head and opening of the mouth when the corner of the mouth or cheek is stroked (rooting).  Increased sucking sounds, smacking lips, cooing, sighing, or squeaking.  Hand-to-mouth movements.  Increased sucking of fingers or hands.  Fussing.  Intermittent crying.  Signs of extreme hunger will require calming and consoling before you try to feed your baby. Signs of extreme hunger may include:  Restlessness.  A loud, strong cry.  Screaming.  Frequent feeding will help you make more milk and will help prevent  problems, such as sore nipples and engorgement of the breasts.  BREASTFEEDING   Whether lying down or sitting, be sure that the baby's abdomen is facing your abdomen.   Support your breast with 4 fingers under your breast   and your thumb above your nipple. Make sure your fingers are well away from your nipple and your baby's mouth.   Stroke your baby's lips gently with your finger or nipple.   When your baby's mouth is open wide enough, place all of your nipple and as much of the colored area around your nipple (areola) as possible into your baby's mouth.  More areola should be visible above his or her upper lip than below his or her lower lip.  Your baby's tongue should be between his or her lower gum and your breast.  Ensure that your baby's mouth is correctly positioned around the nipple (latched). Your baby's lips should create a seal on your breast.  Signs that your baby has effectively latched onto your nipple include:  Tugging or sucking without pain.  Swallowing heard between sucks.  Absent click or smacking sound.  Muscle movement above and in front of his or her ears with sucking.  Your baby must suck about 2 3 minutes in order to get your milk. Allow your baby to feed on each breast as long as he or she wants. Nurse your baby until he or she unlatches or falls asleep at the first breast, then offer the second breast.  Signs that your baby is full and satisfied include:  A gradual decrease in the number of sucks or complete cessation of sucking.  Falling asleep.  Extension or relaxation of his or her body.  Retention of a small amount of milk in his or her mouth.  Letting go of your breast by himself or herself.  Signs of effective breastfeeding in you include:  Breasts that have increased firmness, weight, and size prior to feeding.  Breasts that are softer after nursing.  Increased milk volume, as well as a change in milk consistency and color by the 5th  day of breastfeeding.  Breast fullness relieved by breastfeeding.  Nipples are not sore, cracked, or bleeding.  If needed, break the suction by putting your finger into the corner of your baby's mouth and sliding your finger between his or her gums. Then, remove your breast from his or her mouth.  It is common for babies to spit up a small amount after a feeding.  Babies often swallow air during feeding. This can make babies fussy. Burping your baby between breasts can help with this.  Vitamin D supplements are recommended for babies who get only breast milk.  Avoid using a pacifier during your baby's first 4 6 weeks.  Avoid supplemental feedings of water, formula, or juice in place of breastfeeding. Breast milk is all the food your baby needs. It is not necessary for your baby to have water or formula. Your breasts will make more milk if supplemental feedings are avoided during the early weeks. HOW TO TELL WHETHER YOUR BABY IS GETTING ENOUGH BREAST MILK Wondering whether or not your baby is getting enough milk is a common concern among mothers. You can be assured that your baby is getting enough milk if:   Your baby is actively sucking and you hear swallowing.   Your baby seems relaxed and satisfied after a feeding.   Your baby nurses at least 8 12 times in a 24 hour time period.  During the first 3 5 days of age:  Your baby is wetting at least 3 5 diapers in a 24 hour period. The urine should be clear and pale yellow.  Your baby is having at least 3 4 stools in   a 24 hour period. The stool should be soft and yellow.  At 5 7 days of age, your baby is having at least 3 6 stools in a 24 hour period. The stool should be seedy and yellow by 5 days of age.  Your baby has a weight loss less than 7 10% during the first 3 days of age.  Your baby does not lose weight after 3 7 days of age.  Your baby gains 4 7 ounces each week after he or she is 4 days of age.  Your baby gains weight  by 5 days of age and is back to birth weight within 2 weeks. ENGORGEMENT In the first week after your baby is born, you may experience extremely full breasts (engorgement). When engorged, your breasts may feel heavy, warm, or tender to the touch. Engorgement peaks within 24 48 hours after delivery of your baby.  Engorgement may be reduced by:  Continuing to breastfeed.  Increasing the frequency of breastfeeding.  Taking warm showers or applying warm, moist heat to your breasts just before each feeding. This increases circulation and helps the milk flow.   Gently massaging your breast before and during the feedings. With your fingertips, massage from your chest wall towards your nipple in a circular motion.   Ensuring that your baby empties at least one breast at every feeding. It also helps to start the next feeding on the opposite breast.   Expressing breast milk by hand or by using a breast pump to empty the breasts if your baby is sleepy, or not nursing well. You may also want to express milk if you are returning to work oryou feel you are getting engorged.  Ensuring your baby is latched on and positioned properly while breastfeeding. If you follow these suggestions, your engorgement should improve in 24 48 hours. If you are still experiencing difficulty, call your lactation consultant or caregiver.  CARING FOR YOURSELF Take care of your breasts.  Bathe or shower daily.   Avoid using soap on your nipples.   Wear a supportive bra. Avoid wearing underwire style bras.  Air dry your nipples for a 3 4minutes after each feeding.   Use only cotton bra pads to absorb breast milk leakage. Leaking of breast milk between feedings is normal.   Use only pure lanolin on your nipples after nursing. You do not need to wash it off before feeding your baby again. Another option is to express a few drops of breast milk and gently massage that milk into your nipples.  Continue breast  self-awareness checks. Take care of yourself.  Eat healthy foods. Alternate 3 meals with 3 snacks.  Avoid foods that you notice affect your baby in a bad way.  Drink milk, fruit juice, and water to satisfy your thirst (about 8 glasses a day).   Rest often, relax, and take your prenatal vitamins to prevent fatigue, stress, and anemia.  Avoid chewing and smoking tobacco.  Avoid alcohol and drug use.  Take over-the-counter and prescribed medicine only as directed by your caregiver or pharmacist. You should always check with your caregiver or pharmacist before taking any new medicine, vitamin, or herbal supplement.  Know that pregnancy is possible while breastfeeding. If desired, talk to your caregiver about family planning and safe birth control methods that may be used while breastfeeding. SEEK MEDICAL CARE IF:   You feel like you want to stop breastfeeding or have become frustrated with breastfeeding.  You have painful breasts or nipples.    Your nipples are cracked or bleeding.  Your breasts are red, tender, or warm.  You have a swollen area on either breast.  You have a fever or chills.  You have nausea or vomiting.  You have drainage from your nipples.  Your breasts do not become full before feedings by the 5th day after delivery.  You feel sad and depressed.  Your baby is too sleepy to eat well.  Your baby is having trouble sleeping.   Your baby is wetting less than 3 diapers in a 24 hour period.  Your baby has less than 3 stools in a 24 hour period.  Your baby's skin or the white part of his or her eyes becomes more yellow.   Your baby is not gaining weight by 5 days of age. MAKE SURE YOU:   Understand these instructions.  Will watch your condition.  Will get help right away if you are not doing well or get worse. Document Released: 08/03/2005 Document Revised: 04/27/2012 Document Reviewed: 03/09/2012 [employer] Patient Information 2014 [employer].  

## 2013-02-13 NOTE — Progress Notes (Signed)
Pulse- 86  Edema-legs/ankles   Pressure-vaginal pressure  Pain-lower back Pt went to [facility] on Tuesday for loss of mucous plug

## 2013-02-14 DIAGNOSIS — T8859XA Other complications of anesthesia, initial encounter: Secondary | ICD-10-CM

## 2013-02-14 DIAGNOSIS — R569 Unspecified convulsions: Secondary | ICD-10-CM

## 2013-02-14 HISTORY — DX: Other complications of anesthesia, initial encounter: T88.59XA

## 2013-02-14 HISTORY — DX: Unspecified convulsions: R56.9

## 2013-02-21 ENCOUNTER — Ambulatory Visit (INDEPENDENT_AMBULATORY_CARE_PROVIDER_SITE_OTHER): Payer: Medicaid Other | Admitting: Obstetrics & Gynecology

## 2013-02-21 VITALS — BP 113/73 | Temp 97.8°F | Wt 158.0 lb

## 2013-02-21 DIAGNOSIS — O0933 Supervision of pregnancy with insufficient antenatal care, third trimester: Secondary | ICD-10-CM

## 2013-02-21 DIAGNOSIS — O280 Abnormal hematological finding on antenatal screening of mother: Secondary | ICD-10-CM

## 2013-02-21 DIAGNOSIS — O289 Unspecified abnormal findings on antenatal screening of mother: Secondary | ICD-10-CM

## 2013-02-21 DIAGNOSIS — A630 Anogenital (venereal) warts: Secondary | ICD-10-CM

## 2013-02-21 DIAGNOSIS — O0930 Supervision of pregnancy with insufficient antenatal care, unspecified trimester: Secondary | ICD-10-CM

## 2013-02-21 LAB — POCT URINALYSIS DIP (DEVICE)
Glucose, UA: NEGATIVE mg/dL
Nitrite: NEGATIVE
Urobilinogen, UA: 1 mg/dL (ref 0.0–1.0)

## 2013-02-21 NOTE — Progress Notes (Signed)
Pulse- 79  Edema-feet  Pain/pressure-lower back and vaginal pressure

## 2013-02-21 NOTE — Progress Notes (Signed)
GC CT GBS done today, vaginal warts seen.

## 2013-02-21 NOTE — Patient Instructions (Signed)
Vaginal Delivery  Your caregiver must first be sure you are in labor. Signs of labor include:   You may pass what is called "the mucus plug" before labor begins. This is a small amount of blood stained mucus.   Regular uterine contractions.   The time between contractions get closer together.   The discomfort and pain gradually gets more intense.   Pains are mostly located in the back.   Pains get worse when walking.   The cervix (the opening of the uterus) becomes thinner (begins to efface) and opens up (dilates).  Once you are in labor and admitted into the hospital or care center, your caregiver will do the following:   A complete physical examination.   Check your vital signs (blood pressure, pulse, temperature and the fetal heart rate).   Do a vaginal examination (using a sterile glove and lubricant) to determine:   The position (presentation) of the baby (head [vertex] or buttock first).   The level (station) of the baby's head in the birth canal.   The effacement and dilatation of the cervix.   You may have your pubic hair shaved and be given an enema depending on your caregiver and the circumstance.   An electronic monitor is usually placed on your abdomen. The monitor follows the length and intensity of the contractions, as well as the baby's heart rate.   Usually, your caregiver will insert an IV in your arm with a bottle of sugar water. This is done as a precaution so that medications can be given to you quickly during labor or delivery.  NORMAL LABOR AND DELIVERY IS DIVIDED UP INTO 3 STAGES:  First Stage  This is when regular contractions begin and the cervix begins to efface and dilate. This stage can last from 3 to 15 hours. The end of the first stage is when the cervix is 100% effaced and 10 centimeters dilated. Pain medications may be given by    Injection (morphine, demerol, etc.)    Regional anesthesia (spinal, caudal or epidural, anesthetics given in different locations of the spine). Paracervical pain medication may be given, which is an injection of and anesthetic on each side of the cervix.  A pregnant woman may request to have "Natural Childbirth" which is not to have any medications or anesthesia during her labor and delivery.  Second Stage  This is when the baby comes down through the birth canal (vagina) and is born. This can take 1 to 4 hours. As the baby's head comes down through the birth canal, you may feel like you are going to have a bowel movement. You will get the urge to bear down and push until the baby is delivered. As the baby's head is being delivered, the caregiver will decide if an episiotomy (a cut in the perineum and vagina area) is needed to prevent tearing of the tissue in this area. The episiotomy is sewn up after the delivery of the baby and placenta. Sometimes a mask with nitrous oxide is given for the mother to breath during the delivery of the baby to help if there is too much pain. The end of Stage 2 is when the baby is fully delivered. Then when the umbilical cord stops pulsating it is clamped and cut.  Third Stage  The third stage begins after the baby is completely delivered and ends after the placenta (afterbirth) is delivered. This usually takes 5 to 30 minutes. After the placenta is delivered, a medication is given   either by intravenous or injection to help contract the uterus and prevent bleeding. The third stage is not painful and pain medication is usually not necessary. If an episiotomy was done, it is repaired at this time.  After the delivery, the mother is watched and monitored closely for 1 to 2 hours to make sure there is no postpartum bleeding (hemorrhage). If there is a lot of bleeding, medication is given to contract the uterus and stop the bleeding.  Document Released: 05/12/2008 Document Revised: 04/27/2012 Document Reviewed: 05/12/2008   [employer] Patient Information 2014 [employer].

## 2013-02-23 ENCOUNTER — Encounter (HOSPITAL_COMMUNITY): Payer: Self-pay | Admitting: *Deleted

## 2013-02-23 ENCOUNTER — Inpatient Hospital Stay (HOSPITAL_COMMUNITY)
Admission: AD | Admit: 2013-02-23 | Discharge: 2013-02-23 | Disposition: A | Discharge status: Home / Self Care | Payer: Medicaid Other | Source: Ambulatory Visit | Attending: Obstetrics & Gynecology | Admitting: Obstetrics & Gynecology

## 2013-02-23 DIAGNOSIS — IMO0002 Reserved for concepts with insufficient information to code with codable children: Secondary | ICD-10-CM

## 2013-02-23 DIAGNOSIS — O479 False labor, unspecified: Secondary | ICD-10-CM | POA: Insufficient documentation

## 2013-02-23 DIAGNOSIS — M549 Dorsalgia, unspecified: Secondary | ICD-10-CM | POA: Insufficient documentation

## 2013-02-23 DIAGNOSIS — Z8759 Personal history of other complications of pregnancy, childbirth and the puerperium: Secondary | ICD-10-CM

## 2013-02-23 DIAGNOSIS — O0933 Supervision of pregnancy with insufficient antenatal care, third trimester: Secondary | ICD-10-CM

## 2013-02-23 DIAGNOSIS — O280 Abnormal hematological finding on antenatal screening of mother: Secondary | ICD-10-CM

## 2013-02-23 DIAGNOSIS — O4700 False labor before 37 completed weeks of gestation, unspecified trimester: Secondary | ICD-10-CM | POA: Insufficient documentation

## 2013-02-23 DIAGNOSIS — Z302 Encounter for sterilization: Secondary | ICD-10-CM

## 2013-02-23 DIAGNOSIS — O99013 Anemia complicating pregnancy, third trimester: Secondary | ICD-10-CM

## 2013-02-23 DIAGNOSIS — R6889 Other general symptoms and signs: Secondary | ICD-10-CM

## 2013-02-23 DIAGNOSIS — N949 Unspecified condition associated with female genital organs and menstrual cycle: Secondary | ICD-10-CM | POA: Insufficient documentation

## 2013-02-23 LAB — URINALYSIS, ROUTINE W REFLEX MICROSCOPIC
Leukocytes, UA: NEGATIVE
Protein, ur: NEGATIVE mg/dL
Urobilinogen, UA: 0.2 mg/dL (ref 0.0–1.0)

## 2013-02-23 LAB — GC/CHLAMYDIA PROBE AMP
CT Probe RNA: NEGATIVE
GC Probe RNA: NEGATIVE

## 2013-02-23 LAB — URINE MICROSCOPIC-ADD ON: WBC, UA: NONE SEEN WBC/hpf (ref <3)

## 2013-02-23 LAB — AMNISURE RUPTURE OF MEMBRANE (ROM) NOT AT ARMC: Amnisure ROM: NEGATIVE

## 2013-02-23 MED ORDER — OXYCODONE-ACETAMINOPHEN 5-325 MG PO TABS: 1.0000 | ORAL_TABLET | Freq: Every evening | ORAL | Status: DC | PRN

## 2013-02-23 MED ORDER — NIFEDIPINE 10 MG PO CAPS
20.0000 mg | ORAL_CAPSULE | Freq: Four times a day (QID) | ORAL | Status: DC
  Administered 2013-02-23: 20 mg via ORAL
  Filled 2013-02-23: qty 2

## 2013-02-23 MED ORDER — NALBUPHINE SYRINGE 5 MG/0.5 ML
10.0000 mg | INJECTION | INTRAMUSCULAR | Status: DC | PRN
  Administered 2013-02-23: 10 mg via INTRAMUSCULAR
  Filled 2013-02-23: qty 1

## 2013-02-23 NOTE — MAU Provider Note (Signed)
History     CSN: [id number]  Arrival date and time: 02/23/13 1740   None     Chief Complaint  Patient presents with  . Rupture of Membranes   HPI 37 y.o. [id number] at [redacted]w[redacted]d with leaking fluid for 2 days and contractions every 5-6 minutes for past 2 days. Has been contracting for 2 months, but more intense and frequent over past few days. Also has chronic back pain during this pregnancy. Has tried tylenol and flexeril but not working. Wants to be induced due to intractable pain. No fever, chills, vaginal discharge, dysuria, diarrhea, constipation, nausea or vomiting. No bleeding. Baby moving well.  Receives care at [facility] due to history of fetal demise and preterm delivery. She also had an abnormal quad screen showing increased risk for Down Syndrome but declined further testing. Normal ultrasounds.  OB History   Grav Para Term Preterm Abortions TAB SAB Ect Mult Living   9 6 4 2 2 1  0 1  6      Past Medical History  Diagnosis Date  . Seizures     Last seizure March 2013  . Abnormal Pap smear     Past Surgical History  Procedure Laterality Date  . Wisdom tooth extraction    . Laparoscopy  11/05/2011    Procedure: LAPAROSCOPY OPERATIVE;  Surgeon: Jerene Bears, MD;  Location: [facility];  Service: Gynecology;  Laterality: N/A;  Right Salpingectomy  . Laparoscopic salpingo oopherectomy      Right     Family History  Problem Relation Age of Onset  . Heart disease Mother   . Hypertension Mother     History  Substance Use Topics  . Smoking status: Current Every Day Smoker -- 0.50 packs/day    Types: Cigarettes  . Smokeless tobacco: Not on file  . Alcohol Use: No    Allergies: No Known Allergies  Prescriptions prior to admission  Medication Sig Dispense Refill  . acetaminophen (TYLENOL) 500 MG tablet Take 1,000 mg by mouth every 6 (six) hours as needed for pain (For headache.).      Marland Kitchen acetaminophen-codeine (TYLENOL #3) 300-30 MG per tablet Take 1 tablet by mouth  daily as needed for pain.      . IRON PO Take 1 tablet by mouth 2 (two) times daily.      . Prenatal Vit-Fe Fumarate-FA (PRENATAL MULTIVITAMIN) TABS Take 1 tablet by mouth daily at 12 noon.        ROS  See HPI  Physical Exam   Blood pressure 109/74, pulse 99, temperature 97.2 F (36.2 C), temperature source Oral, resp. rate 16, height 5' 3.5" (1.613 m), weight 72.031 kg (158 lb 12.8 oz), last menstrual period 06/02/2012.  Physical Exam GEN:  WNWD, no distress HEENT:  NCAT, EOMI, conjunctiva clear CV: RRR, no murmur RESP:  CTAB ABD:  Soft, non-tender, no guarding or rebound, normal bowel sounds EXTREM:  Warm, well perfused, no edema or tenderness NEURO:  Alert, oriented, no focal deficits  Dilation: 3 Effacement (%): 50 Cervical Position: Middle Station: -3 Exam by:: Dr Thad Ranger  Results for orders placed during the hospital encounter of 02/23/13 (from the past 24 hour(s))  URINALYSIS, ROUTINE W REFLEX MICROSCOPIC     Status: Abnormal   Collection Time    02/23/13  6:15 PM      Result Value Range   Color, Urine YELLOW  YELLOW   APPearance CLEAR  CLEAR   Specific Gravity, Urine <1.005 (*) 1.005 - 1.030  pH 6.0  5.0 - 8.0   Glucose, UA NEGATIVE  NEGATIVE mg/dL   Hgb urine dipstick TRACE (*) NEGATIVE   Bilirubin Urine NEGATIVE  NEGATIVE   Ketones, ur NEGATIVE  NEGATIVE mg/dL   Protein, ur NEGATIVE  NEGATIVE mg/dL   Urobilinogen, UA 0.2  0.0 - 1.0 mg/dL   Nitrite NEGATIVE  NEGATIVE   Leukocytes, UA NEGATIVE  NEGATIVE  URINE MICROSCOPIC-ADD ON     Status: Abnormal   Collection Time    02/23/13  6:15 PM      Result Value Range   Squamous Epithelial / LPF FEW (*) RARE   WBC, UA    <3 WBC/hpf   Value: NO FORMED ELEMENTS SEEN ON URINE MICROSCOPIC EXAMINATION   RBC / HPF 0-2  <3 RBC/hpf   Bacteria, UA FEW (*) RARE  AMNISURE RUPTURE OF MEMBRANE (ROM)     Status: None   Collection Time    02/23/13  7:05 PM      Result Value Range   Amnisure ROM NEGATIVE    WET PREP, GENITAL      Status: Abnormal   Collection Time    02/23/13  8:50 PM      Result Value Range   Yeast Wet Prep HPF POC NONE SEEN  NONE SEEN   Trich, Wet Prep NONE SEEN  NONE SEEN   Clue Cells Wet Prep HPF POC NONE SEEN  NONE SEEN   WBC, Wet Prep HPF POC FEW (*) NONE SEEN   FHTs: 140-150, moderate variability, accels present, no decels TOCO:  q 3-6 minutes initially  MAU Course  Procedures  Pt given nubain in MAU - pain resolved. Also given procardia, and contractions resolved - just uterine irritability  Assessment and Plan  37 y.o. [id number] at [redacted]w[redacted]d with vaginal discharge, contractions and back pain - No ROM, amnisure negative, wet prep negative - Braxton-Hicks contractions - resolved with procardia. Not sent home with procardia since 37 weeks tomorrow - Back pain - gave small amt of percocet for sleep (qhs) - FHTs reassuring, stable for discharge home  Napoleon Form 02/23/2013, 7:55 PM

## 2013-02-23 NOTE — MAU Note (Signed)
Patient states she has been leaking clear fluid since 1100 7-9. States she is having contractions every 5-6 minutes. Reports good fetal movement. Denies bleeding.

## 2013-02-23 NOTE — MAU Note (Signed)
States that she has been contracting since last month; states that these ucs are worsening her seizure condition; G9P6; cervix feeling "bumpy"- will have MD assess;

## 2013-02-24 LAB — CULTURE, BETA STREP (GROUP B ONLY)

## 2013-02-28 ENCOUNTER — Ambulatory Visit (INDEPENDENT_AMBULATORY_CARE_PROVIDER_SITE_OTHER): Payer: Medicaid Other | Admitting: Obstetrics & Gynecology

## 2013-02-28 VITALS — BP 114/68 | Wt 161.9 lb

## 2013-02-28 DIAGNOSIS — O99820 Streptococcus B carrier state complicating pregnancy: Secondary | ICD-10-CM

## 2013-02-28 DIAGNOSIS — Z22330 Carrier of Group B streptococcus: Secondary | ICD-10-CM

## 2013-02-28 DIAGNOSIS — O0933 Supervision of pregnancy with insufficient antenatal care, third trimester: Secondary | ICD-10-CM

## 2013-02-28 DIAGNOSIS — Z302 Encounter for sterilization: Secondary | ICD-10-CM

## 2013-02-28 DIAGNOSIS — O09899 Supervision of other high risk pregnancies, unspecified trimester: Secondary | ICD-10-CM

## 2013-02-28 DIAGNOSIS — O0930 Supervision of pregnancy with insufficient antenatal care, unspecified trimester: Secondary | ICD-10-CM

## 2013-02-28 LAB — POCT URINALYSIS DIP (DEVICE)
Bilirubin Urine: NEGATIVE
Ketones, ur: NEGATIVE mg/dL
pH: 7 (ref 5.0–8.0)

## 2013-02-28 NOTE — Patient Instructions (Signed)
Return to clinic for any obstetric concerns or go to MAU for evaluation  

## 2013-02-28 NOTE — Progress Notes (Signed)
Patient reported presyncopal episode yesterday, recommended adequate hydration and to come to MAU for worsening symptoms.  GBS negative.  No cervical change. No other complaints or concerns.  Fetal movement and labor precautions reviewed.

## 2013-03-04 ENCOUNTER — Encounter (HOSPITAL_COMMUNITY): Payer: Self-pay | Admitting: *Deleted

## 2013-03-04 ENCOUNTER — Inpatient Hospital Stay (HOSPITAL_COMMUNITY)
Admission: AD | Admit: 2013-03-04 | Discharge: 2013-03-07 | DRG: 775 | Disposition: A | Discharge status: Home / Self Care | Payer: Medicaid Other | Source: Ambulatory Visit | Attending: Obstetrics and Gynecology | Admitting: Obstetrics and Gynecology

## 2013-03-04 DIAGNOSIS — O09519 Supervision of elderly primigravida, unspecified trimester: Secondary | ICD-10-CM | POA: Diagnosis present

## 2013-03-04 DIAGNOSIS — Z8759 Personal history of other complications of pregnancy, childbirth and the puerperium: Secondary | ICD-10-CM

## 2013-03-04 DIAGNOSIS — O2650 Maternal hypotension syndrome, unspecified trimester: Secondary | ICD-10-CM | POA: Diagnosis present

## 2013-03-04 DIAGNOSIS — G40909 Epilepsy, unspecified, not intractable, without status epilepticus: Secondary | ICD-10-CM | POA: Diagnosis present

## 2013-03-04 DIAGNOSIS — O9902 Anemia complicating childbirth: Principal | ICD-10-CM | POA: Diagnosis present

## 2013-03-04 DIAGNOSIS — IMO0002 Reserved for concepts with insufficient information to code with codable children: Secondary | ICD-10-CM

## 2013-03-04 DIAGNOSIS — IMO0001 Reserved for inherently not codable concepts without codable children: Secondary | ICD-10-CM

## 2013-03-04 DIAGNOSIS — D649 Anemia, unspecified: Secondary | ICD-10-CM | POA: Diagnosis present

## 2013-03-04 DIAGNOSIS — O99019 Anemia complicating pregnancy, unspecified trimester: Secondary | ICD-10-CM

## 2013-03-04 DIAGNOSIS — Z302 Encounter for sterilization: Secondary | ICD-10-CM

## 2013-03-04 DIAGNOSIS — O99334 Smoking (tobacco) complicating childbirth: Secondary | ICD-10-CM | POA: Diagnosis present

## 2013-03-04 DIAGNOSIS — R42 Dizziness and giddiness: Secondary | ICD-10-CM

## 2013-03-04 DIAGNOSIS — O280 Abnormal hematological finding on antenatal screening of mother: Secondary | ICD-10-CM

## 2013-03-04 DIAGNOSIS — O99354 Diseases of the nervous system complicating childbirth: Secondary | ICD-10-CM | POA: Diagnosis present

## 2013-03-04 LAB — CBC
HCT: 31 % — ABNORMAL LOW (ref 36.0–46.0)
MCHC: 35.2 g/dL (ref 30.0–36.0)
MCV: 93.7 fL (ref 78.0–100.0)
Platelets: 241 10*3/uL (ref 150–400)
RDW: 12.9 % (ref 11.5–15.5)
WBC: 12.7 10*3/uL — ABNORMAL HIGH (ref 4.0–10.5)

## 2013-03-04 LAB — CBC WITH DIFFERENTIAL/PLATELET
HCT: 30.3 % — ABNORMAL LOW (ref 36.0–46.0)
Hemoglobin: 10.7 g/dL — ABNORMAL LOW (ref 12.0–15.0)
Lymphocytes Relative: 21 % (ref 12–46)
Lymphs Abs: 2.5 10*3/uL (ref 0.7–4.0)
MCHC: 35.3 g/dL (ref 30.0–36.0)
Monocytes Absolute: 0.9 10*3/uL (ref 0.1–1.0)
Monocytes Relative: 7 % (ref 3–12)
Neutro Abs: 8.4 10*3/uL — ABNORMAL HIGH (ref 1.7–7.7)
RBC: 3.29 MIL/uL — ABNORMAL LOW (ref 3.87–5.11)
WBC: 11.9 10*3/uL — ABNORMAL HIGH (ref 4.0–10.5)

## 2013-03-04 LAB — URINE MICROSCOPIC-ADD ON

## 2013-03-04 LAB — GLUCOSE, CAPILLARY: Glucose-Capillary: 90 mg/dL (ref 70–99)

## 2013-03-04 LAB — URINALYSIS, ROUTINE W REFLEX MICROSCOPIC
Bilirubin Urine: NEGATIVE
Nitrite: NEGATIVE
Specific Gravity, Urine: <1.005 — ABNORMAL LOW (ref 1.005–1.030)
pH: 6 (ref 5.0–8.0)

## 2013-03-04 LAB — WET PREP, GENITAL
Clue Cells Wet Prep HPF POC: NONE SEEN
Trich, Wet Prep: NONE SEEN

## 2013-03-04 MED ORDER — CITRIC ACID-SODIUM CITRATE 334-500 MG/5ML PO SOLN: 30.0000 mL | ORAL | Status: DC | PRN

## 2013-03-04 MED ORDER — IBUPROFEN 600 MG PO TABS: 600.0000 mg | ORAL_TABLET | Freq: Four times a day (QID) | ORAL | Status: DC | PRN

## 2013-03-04 MED ORDER — ACETAMINOPHEN 325 MG PO TABS: 650.0000 mg | ORAL_TABLET | Freq: Once | ORAL | Status: DC

## 2013-03-04 MED ORDER — LIDOCAINE HCL (PF) 1 % IJ SOLN
30.0000 mL | INTRAMUSCULAR | Status: DC | PRN
  Filled 2013-03-04: qty 30

## 2013-03-04 MED ORDER — FENTANYL 2.5 MCG/ML BUPIVACAINE 1/10 % EPIDURAL INFUSION (WH - ANES)
14.0000 mL/h | INTRAMUSCULAR | Status: DC | PRN
  Administered 2013-03-05: 14 mL/h via EPIDURAL
  Filled 2013-03-04: qty 125

## 2013-03-04 MED ORDER — ONDANSETRON HCL 4 MG/2ML IJ SOLN: 4.0000 mg | Freq: Four times a day (QID) | INTRAMUSCULAR | Status: DC | PRN

## 2013-03-04 MED ORDER — OXYCODONE-ACETAMINOPHEN 5-325 MG PO TABS: 1.0000 | ORAL_TABLET | ORAL | Status: DC | PRN

## 2013-03-04 MED ORDER — LACTATED RINGERS IV SOLN
500.0000 mL | INTRAVENOUS | Status: DC | PRN
  Administered 2013-03-05: 500 mL via INTRAVENOUS

## 2013-03-04 MED ORDER — LACTATED RINGERS IV SOLN
INTRAVENOUS | Status: DC
  Administered 2013-03-04 – 2013-03-05 (×2): via INTRAVENOUS
  Administered 2013-03-05: 125 mL/h via INTRAVENOUS

## 2013-03-04 MED ORDER — EPHEDRINE 5 MG/ML INJ
10.0000 mg | INTRAVENOUS | Status: DC | PRN
  Filled 2013-03-04: qty 4

## 2013-03-04 MED ORDER — OXYTOCIN BOLUS FROM INFUSION: 500.0000 mL | INTRAVENOUS | Status: DC

## 2013-03-04 MED ORDER — FLEET ENEMA 7-19 GM/118ML RE ENEM: 1.0000 | ENEMA | RECTAL | Status: DC | PRN

## 2013-03-04 MED ORDER — ACETAMINOPHEN 325 MG PO TABS: 650.0000 mg | ORAL_TABLET | ORAL | Status: DC | PRN

## 2013-03-04 MED ORDER — FENTANYL CITRATE 0.05 MG/ML IJ SOLN
100.0000 ug | INTRAMUSCULAR | Status: DC | PRN
  Administered 2013-03-04 – 2013-03-05 (×4): 100 ug via INTRAVENOUS
  Filled 2013-03-04 (×4): qty 2

## 2013-03-04 MED ORDER — PHENYLEPHRINE 40 MCG/ML (10ML) SYRINGE FOR IV PUSH (FOR BLOOD PRESSURE SUPPORT)
80.0000 ug | PREFILLED_SYRINGE | INTRAVENOUS | Status: DC | PRN
  Filled 2013-03-04: qty 5

## 2013-03-04 MED ORDER — HYDROXYZINE HCL 50 MG PO TABS
50.0000 mg | ORAL_TABLET | Freq: Four times a day (QID) | ORAL | Status: DC | PRN
  Filled 2013-03-04: qty 1

## 2013-03-04 MED ORDER — EPHEDRINE 5 MG/ML INJ
10.0000 mg | INTRAVENOUS | Status: DC | PRN
  Administered 2013-03-05: 10 mg via INTRAVENOUS

## 2013-03-04 MED ORDER — LACTATED RINGERS IV SOLN
500.0000 mL | Freq: Once | INTRAVENOUS | Status: AC
  Administered 2013-03-05: 500 mL via INTRAVENOUS

## 2013-03-04 MED ORDER — OXYTOCIN 40 UNITS IN LACTATED RINGERS INFUSION - SIMPLE MED
62.5000 mL/h | INTRAVENOUS | Status: DC
  Administered 2013-03-05: 62.5 mL/h via INTRAVENOUS

## 2013-03-04 MED ORDER — DIPHENHYDRAMINE HCL 50 MG/ML IJ SOLN: 12.5000 mg | INTRAMUSCULAR | Status: DC | PRN

## 2013-03-04 MED ORDER — PHENYLEPHRINE 40 MCG/ML (10ML) SYRINGE FOR IV PUSH (FOR BLOOD PRESSURE SUPPORT): 80.0000 ug | PREFILLED_SYRINGE | INTRAVENOUS | Status: DC | PRN

## 2013-03-04 NOTE — H&P (Signed)
Katie Malone is a 62 y.W.[id number] female at [redacted]w[redacted]d by 19.3wk u/s, who was initially evaluated in MAU for report of dizziness x ~1wk w/ report of falling to knees earlier today d/t dizziness.  Was monitored x 4 hours, had normal labs, reactive nst, reported good fm, no vb- was d/c'd home. After pt had been given her d/c papers by RN, she went to br to void, and as she stood up had a large gush of clear fluid, fern pos by RN and myself.    Initiated pnc @ [facility] @ 19.6 and transferred to [facility]  @ 21.4wks d/t h/o PTB.  Early 1hr glucola 48, repeat 101.  Quad screen revealed increased r/f down syndrome 1: 185, pt declined further testing. Anatomy u/s normal. GBS neg. ASCUS w/ pos high risk HPV pap- needs pp colpo.  Plans to breastfeed and desires pp BTL.  H/O 4 term SVDs, 2 PTB @ 31 and 35wks, was offered 17P, declined.  H/O pph after 1st birth. Largest baby 7lb5oz. H/O ectopic pregnancy in 2013 w/ emergent Rt salpingectomy, had witnessed seizure at that time, none since. Has had migraines w/ auro- had order for neuro referral but per pt she has not gone.    Maternal Medical History:  Reason for admission: Rupture of membranes.   Fetal activity: Perceived fetal activity is normal.    Prenatal Complications - Diabetes: none.    OB History   Grav Para Term Preterm Abortions TAB SAB Ect Mult Living   9 6 4 2 2 1  0 1  6     Past Medical History  Diagnosis Date  . Seizures     Last seizure March 2013  . Abnormal Pap smear    Past Surgical History  Procedure Laterality Date  . Wisdom tooth extraction    . Laparoscopy  11/05/2011    Procedure: LAPAROSCOPY OPERATIVE;  Surgeon: Jerene Bears, MD;  Location: [facility];  Service: Gynecology;  Laterality: N/A;  Right Salpingectomy  . Laparoscopic salpingo oopherectomy      Right    Family History: family history includes Heart disease in her mother and Hypertension in her mother. Social History:  reports that she has been smoking Cigarettes.  She has been  smoking about 0.50 packs per day. She does not have any smokeless tobacco history on file. She reports that she does not drink alcohol or use illicit drugs.   Review of Systems  Constitutional: Negative.   Eyes: Negative.   Respiratory: Negative.   Cardiovascular: Negative.   Gastrointestinal: Positive for abdominal pain (uc's).  Genitourinary: Negative.   Musculoskeletal: Positive for back pain (low back pain w/ uc's).  Skin: Negative.   Neurological: Positive for dizziness and headaches (not at present).  Endo/Heme/Allergies: Negative.   Psychiatric/Behavioral: Negative.     Dilation: 3 Effacement (%): 50 Station: -2 Exam by:: Dr. Ike Bene @ 1755 Blood pressure 124/68, pulse 97, temperature 98.3 F (36.8 C), temperature source Oral, resp. rate 18, height 5\' 2"  (1.575 m), weight 72.122 kg (159 lb), last menstrual period 06/02/2012. Maternal Exam:  Uterine Assessment: Contraction strength is mild.  Contraction frequency is regular.   Abdomen: Patient reports no abdominal tenderness. Fetal presentation: vertex     Fetal Exam Fetal Monitor Review: Mode: ultrasound.   Baseline rate: 145.  Variability: moderate (6-25 bpm).   Pattern: accelerations present and no decelerations.    Fetal State Assessment: Category I - tracings are normal.     Physical Exam  Constitutional: She is  oriented to person, place, and time. She appears well-developed and well-nourished.  HENT:  Head: Normocephalic.  Neck: Normal range of motion.  Cardiovascular: Normal rate.   Respiratory: Effort normal.  GI: Soft.  gravid  Genitourinary:  1755 by Dr. Ike Bene: 3/50/-2  Musculoskeletal: Normal range of motion.  Neurological: She is alert and oriented to person, place, and time. She has normal reflexes.  Skin: Skin is warm and dry.  Psychiatric: She has a normal mood and affect. Her behavior is normal. Judgment and thought content normal.    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

## 2013-03-04 NOTE — MAU Note (Signed)
PT  WAS    D/C-  OFF MONITOR-   WENT TO B-ROOM-    HAD GUSH AFTERWARDS     - POSTIVE FERN

## 2013-03-04 NOTE — MAU Provider Note (Signed)
History     CSN: [id number]  Arrival date and time: 03/04/13 1607   None     Chief Complaint  Patient presents with  . Dizziness   HPI 37 y.o. [id number] at [redacted]w[redacted]d presents with complaints of severe dizziness for last 1.5weeks with feeling unsteady, light headed and like she may pass out. Today she whited out with near syncope and fell to knees. Did not hit abdomen. +FM, No LOF, Intermittent cntx.  Also pt reports loss mucous plug 2 weeks ago and has small spotting every other day.   Pt has had poor PO intake during her entire pregnacny per pt without improvement.  OB History   Grav Para Term Preterm Abortions TAB SAB Ect Mult Living   9 6 4 2 2 1  0 1  6      Past Medical History  Diagnosis Date  . Seizures     Last seizure March 2013  . Abnormal Pap smear     Past Surgical History  Procedure Laterality Date  . Wisdom tooth extraction    . Laparoscopy  11/05/2011    Procedure: LAPAROSCOPY OPERATIVE;  Surgeon: Jerene Bears, MD;  Location: [facility];  Service: Gynecology;  Laterality: N/A;  Right Salpingectomy  . Laparoscopic salpingo oopherectomy      Right     Family History  Problem Relation Age of Onset  . Heart disease Mother   . Hypertension Mother     History  Substance Use Topics  . Smoking status: Current Every Day Smoker -- 0.50 packs/day    Types: Cigarettes  . Smokeless tobacco: Not on file  . Alcohol Use: No    Allergies: No Known Allergies  Prescriptions prior to admission  Medication Sig Dispense Refill  . IRON PO Take 1 tablet by mouth 2 (two) times daily.      Marland Kitchen oxyCODONE-acetaminophen (PERCOCET/ROXICET) 5-325 MG per tablet Take 1-2 tablets by mouth at bedtime as needed for pain.  20 tablet  0  . Prenatal Vit-Fe Fumarate-FA (PRENATAL MULTIVITAMIN) TABS Take 1 tablet by mouth daily at 12 noon.        Review of Systems  Constitutional: Positive for malaise/fatigue. Negative for fever, chills and diaphoresis.  Eyes: Negative for blurred vision  and double vision.  Cardiovascular: Negative for palpitations.  Neurological: Positive for dizziness. Negative for tingling, tremors, sensory change, speech change, focal weakness, seizures (hx of seizure last reported ) and weakness.     Physical Exam   Blood pressure 124/68, pulse 97, temperature 98.3 F (36.8 C), temperature source Oral, resp. rate 18, height 5\' 2"  (1.575 m), weight 72.122 kg (159 lb), last menstrual period 06/02/2012.  Physical Exam  Nursing note reviewed. Constitutional: She is oriented to person, place, and time. She appears well-developed and well-nourished. No distress.  Negative orthostatic vitals   HENT:  Head: Normocephalic and atraumatic.  Neck: Normal range of motion. Neck supple.  Cardiovascular: Normal rate, regular rhythm and normal heart sounds.   Respiratory: Effort normal and breath sounds normal. No respiratory distress. She has no wheezes. She has no rales. She exhibits no tenderness.  GI: Soft. Bowel sounds are normal. She exhibits no distension (gravid) and no mass. There is no tenderness. There is no rebound and no guarding.  Genitourinary: Vaginal discharge (recent intercourse) found.  Neurological: She is alert and oriented to person, place, and time. She has normal strength. No cranial nerve deficit (grossly negative) or sensory deficit. Coordination normal. GCS eye subscore is 4. GCS verbal  subscore is 5. GCS motor subscore is 6.  Neg dix hallpike  Skin: She is not diaphoretic.  Psychiatric: She has a normal mood and affect. Her behavior is normal. Judgment and thought content normal.   Vaginal: milky white fluid likely semen SVE: 3/50/-2 SSE: neg pooling, neg ferning Neg wet prep  FHT: 130s mod variability + accels no decels Toco: irregular q3-61min ctx  Limited 3rd trimester [place] Bedside done by Dr. Ike Bene: Aloha Gell, VTX +CM 146, Fundal placenta, AFI 9.6  Hemoglobin & Hematocrit     Component Value Date/Time   HGB 10.7* 03/04/2013 1804    HGB 10.8 10/19/2012   HCT 30.3* 03/04/2013 1804   HCT 30 10/19/2012       MAU Course  Procedures  MDM  Assessment and Plan  37 y.o. [id number] at [redacted]w[redacted]d presents with dizziness. Symptoms most consistent with vasovagal vs hypovolemia vs anemia to pregnancy. No evidence of trauma and reassuring NST.  #Anemia: pt currently on iron, 24mo ago hgb 9.8 repeating today. Improved from prior.  #Light headedness: Recommend pt inc fluid intake with electrolytes. ddx hypovolemia vs vasovagal. Less likely  BPV based on symptoms and PE(neg dix/hall pike)  Unlikely arythmia. Stable HR.   #Mild trauma: Fall to knees denies abd trauma. Will monitor on FHR for 4hrs prior to discharge. Reassuring limited US. Recommended safety precautions to prevent falls.  #Reported Spotting: Pt reports frequent intercourse and often has spotting day or two after. No blood in vault. NST Reassuring. Reassuring limited 3rd trimester [place].  Stable - discharge home  Levert Heslop, Audie Clear 03/04/2013, 6:20 PM

## 2013-03-04 NOTE — MAU Note (Signed)
Pt reports she has bee feeling dizzy off and on for several days. Pt mother stated pt has fallen today because she was so dizzy. Pt has seizure disorder and is afraid she will have another one if she continues to feel this dizzy. Also reports having ctx off and on as well with good fetal movement

## 2013-03-05 ENCOUNTER — Encounter (HOSPITAL_COMMUNITY): Payer: Self-pay | Admitting: Anesthesiology

## 2013-03-05 ENCOUNTER — Encounter (HOSPITAL_COMMUNITY): Payer: Self-pay | Admitting: Obstetrics and Gynecology

## 2013-03-05 ENCOUNTER — Inpatient Hospital Stay (HOSPITAL_COMMUNITY): Payer: Medicaid Other | Admitting: Anesthesiology

## 2013-03-05 DIAGNOSIS — O2650 Maternal hypotension syndrome, unspecified trimester: Secondary | ICD-10-CM

## 2013-03-05 DIAGNOSIS — O9902 Anemia complicating childbirth: Secondary | ICD-10-CM

## 2013-03-05 DIAGNOSIS — O99354 Diseases of the nervous system complicating childbirth: Secondary | ICD-10-CM

## 2013-03-05 DIAGNOSIS — G40909 Epilepsy, unspecified, not intractable, without status epilepticus: Secondary | ICD-10-CM

## 2013-03-05 LAB — URINE CULTURE

## 2013-03-05 LAB — RPR: RPR Ser Ql: NONREACTIVE

## 2013-03-05 MED ORDER — DIBUCAINE 1 % RE OINT: 1.0000 "application " | TOPICAL_OINTMENT | RECTAL | Status: DC | PRN

## 2013-03-05 MED ORDER — SENNOSIDES-DOCUSATE SODIUM 8.6-50 MG PO TABS
2.0000 | ORAL_TABLET | Freq: Every day | ORAL | Status: DC
  Administered 2013-03-05 – 2013-03-06 (×2): 2 via ORAL

## 2013-03-05 MED ORDER — ONDANSETRON HCL 4 MG PO TABS: 4.0000 mg | ORAL_TABLET | ORAL | Status: DC | PRN

## 2013-03-05 MED ORDER — ZOLPIDEM TARTRATE 5 MG PO TABS: 5.0000 mg | ORAL_TABLET | Freq: Every evening | ORAL | Status: DC | PRN

## 2013-03-05 MED ORDER — TERBUTALINE SULFATE 1 MG/ML IJ SOLN: 0.2500 mg | Freq: Once | INTRAMUSCULAR | Status: DC | PRN

## 2013-03-05 MED ORDER — DIPHENHYDRAMINE HCL 25 MG PO CAPS: 25.0000 mg | ORAL_CAPSULE | Freq: Four times a day (QID) | ORAL | Status: DC | PRN

## 2013-03-05 MED ORDER — OXYCODONE-ACETAMINOPHEN 5-325 MG PO TABS
1.0000 | ORAL_TABLET | ORAL | Status: DC | PRN
  Administered 2013-03-05 – 2013-03-07 (×6): 1 via ORAL
  Filled 2013-03-05 (×6): qty 1

## 2013-03-05 MED ORDER — ONDANSETRON HCL 4 MG/2ML IJ SOLN: 4.0000 mg | INTRAMUSCULAR | Status: DC | PRN

## 2013-03-05 MED ORDER — PRENATAL MULTIVITAMIN CH
1.0000 | ORAL_TABLET | Freq: Every day | ORAL | Status: DC
  Administered 2013-03-05 – 2013-03-06 (×2): 1 via ORAL
  Filled 2013-03-05 (×2): qty 1

## 2013-03-05 MED ORDER — LANOLIN HYDROUS EX OINT: TOPICAL_OINTMENT | CUTANEOUS | Status: DC | PRN

## 2013-03-05 MED ORDER — TETANUS-DIPHTH-ACELL PERTUSSIS 5-2.5-18.5 LF-MCG/0.5 IM SUSP
0.5000 mL | Freq: Once | INTRAMUSCULAR | Status: AC
  Administered 2013-03-05: 0.5 mL via INTRAMUSCULAR
  Filled 2013-03-05: qty 0.5

## 2013-03-05 MED ORDER — WITCH HAZEL-GLYCERIN EX PADS: 1.0000 "application " | MEDICATED_PAD | CUTANEOUS | Status: DC | PRN

## 2013-03-05 MED ORDER — LIDOCAINE HCL (PF) 1 % IJ SOLN
INTRAMUSCULAR | Status: DC | PRN
  Administered 2013-03-05 (×4): 4 mL

## 2013-03-05 MED ORDER — SIMETHICONE 80 MG PO CHEW: 80.0000 mg | CHEWABLE_TABLET | ORAL | Status: DC | PRN

## 2013-03-05 MED ORDER — IBUPROFEN 600 MG PO TABS
600.0000 mg | ORAL_TABLET | Freq: Four times a day (QID) | ORAL | Status: DC
  Administered 2013-03-05 – 2013-03-07 (×7): 600 mg via ORAL
  Filled 2013-03-05 (×7): qty 1

## 2013-03-05 MED ORDER — BENZOCAINE-MENTHOL 20-0.5 % EX AERO: 1.0000 "application " | INHALATION_SPRAY | CUTANEOUS | Status: DC | PRN

## 2013-03-05 MED ORDER — MISOPROSTOL 200 MCG PO TABS
ORAL_TABLET | ORAL | Status: AC
  Filled 2013-03-05: qty 5

## 2013-03-05 MED ORDER — OXYTOCIN 40 UNITS IN LACTATED RINGERS INFUSION - SIMPLE MED
1.0000 m[IU]/min | INTRAVENOUS | Status: DC
  Administered 2013-03-05: 2 m[IU]/min via INTRAVENOUS
  Filled 2013-03-05: qty 1000

## 2013-03-05 MED ORDER — METHYLERGONOVINE MALEATE 0.2 MG/ML IJ SOLN
INTRAMUSCULAR | Status: AC
  Filled 2013-03-05: qty 1

## 2013-03-05 NOTE — Anesthesia Preprocedure Evaluation (Signed)
Anesthesia Evaluation  Patient identified by MRN, date of birth, ID band Patient awake    Reviewed: Allergy & Precautions, H&P , NPO status , Patient's Chart, lab work & pertinent test results, reviewed documented beta blocker date and time   History of Anesthesia Complications Negative for: history of anesthetic complications  Airway Mallampati: I TM Distance: >3 FB Neck ROM: full    Dental  (+) Poor Dentition   Pulmonary Current Smoker,  breath sounds clear to auscultation        Cardiovascular negative cardio ROS  Rhythm:regular Rate:Normal     Neuro/Psych  Headaches, Seizures - (h/o one seizure in March 2013),  negative psych ROS   GI/Hepatic negative GI ROS, Neg liver ROS,   Endo/Other  negative endocrine ROS  Renal/GU negative Renal ROS  negative genitourinary   Musculoskeletal   Abdominal   Peds  Hematology  (+) anemia ,   Anesthesia Other Findings   Reproductive/Obstetrics (+) Pregnancy                           Anesthesia Physical Anesthesia Plan  ASA: II  Anesthesia Plan: Epidural   Post-op Pain Management:    Induction:   Airway Management Planned:   Additional Equipment:   Intra-op Plan:   Post-operative Plan:   Informed Consent: I have reviewed the patients History and Physical, chart, labs and discussed the procedure including the risks, benefits and alternatives for the proposed anesthesia with the patient or authorized representative who has indicated his/her understanding and acceptance.     Plan Discussed with:   Anesthesia Plan Comments:         Anesthesia Quick Evaluation

## 2013-03-05 NOTE — Lactation Note (Signed)
This note was copied from the chart of Katie Malone. Lactation Consultation Note: Initial visit with this mom who intends to breast feed her baby per notes at 0715 on 03/05/13.. This is mom's 7 th baby but first time she is putting baby to the breast. With some of the others she has pumped for 6 Larin Depaoli to 6 months with manual pump. Mom has baby latched to breast when I went in. Reports that RN assisted her, BF brochure given with resources for support after DC. No questions at present. To call prn  Patient Name: Katie Bertrice Leder [id number] Date: 03/05/2013 Reason for consult: Initial assessment   Maternal Data Formula Feeding for Exclusion: No Infant to breast within first hour of birth: Yes Does the patient have breastfeeding experience prior to this delivery?: Yes  Feeding Feeding Type: Breast Milk Feeding method: Breast Length of feed: 5 min  LATCH Score/Interventions                      Lactation Tools Discussed/Used     Consult Status Consult Status: PRN    Pamelia Hoit 03/05/2013, 1:56 PM

## 2013-03-05 NOTE — H&P (Signed)
Attestation of Attending Supervision of Advanced Practitioner (CNM/NP): Evaluation and management procedures were performed by the Advanced Practitioner under my supervision and collaboration.  I have reviewed the Advanced Practitioner's note and chart, and I agree with the management and plan.  Judithann Villamar 03/05/2013 7:53 AM   

## 2013-03-05 NOTE — Progress Notes (Signed)
Pt requests lanolin for sore nipples.  Comfort gels offered, patient refused.  Encouraged to hand express colostrum and let dry on nipples then follow up with lanolin as needed.

## 2013-03-05 NOTE — Anesthesia Procedure Notes (Signed)
Epidural Patient location during procedure: OB Start time: 03/05/2013 6:41 AM  Staffing Performed by: anesthesiologist   Preanesthetic Checklist Completed: patient identified, site marked, surgical consent, pre-op evaluation, timeout performed, IV checked, risks and benefits discussed and monitors and equipment checked  Epidural Patient position: sitting Prep: site prepped and draped and DuraPrep Patient monitoring: continuous pulse ox and blood pressure Approach: midline Injection technique: LOR air  Needle:  Needle type: Tuohy  Needle gauge: 17 G Needle length: 9 cm and 9 Needle insertion depth: 5 cm cm Catheter type: closed end flexible Catheter size: 19 Gauge Catheter at skin depth: 10 cm Test dose: negative  Assessment Events: blood not aspirated, injection not painful, no injection resistance, negative IV test and no paresthesia  Additional Notes Discussed risk of headache, infection, bleeding, nerve injury and failed or incomplete block.  Patient voices understanding and wishes to proceed.  Epidural placed easily on first attempt.  No paresthesia.  Patient tolerated procedure well with no apparent complications.  Jasmine December, MD Reason for block:procedure for pain

## 2013-03-05 NOTE — Progress Notes (Signed)
Katie Malone is a 37 y.o. [phone] at [redacted]w[redacted]d admitted for rupture of membranes, early labor  Subjective: Uncomfortable w/ uc's, has received pain meds, wanting epidural  Objective: BP 113/71  Pulse 68  Temp(Src) 97.6 F (36.4 C) (Oral)  Resp 16  Ht 5\' 2"  (1.575 m)  Wt 72.122 kg (159 lb)  BMI 29.07 kg/m2  LMP 06/02/2012      FHT:  FHR: 135 bpm, variability: moderate,  accelerations:  Present,  decelerations:  Absent UC:   regular, every 2-6 minutes SVE:   Dilation: 4 Effacement (%): 80 Station: -2 Exam by:: Genella Rife CNM  Labs: Lab Results  Component Value Date   WBC 12.7* 03/04/2013   HGB 10.9* 03/04/2013   HCT 31.0* 03/04/2013   MCV 93.7 03/04/2013   PLT 241 03/04/2013    Assessment / Plan: SROM, early labor, changing slowly, will augment w/ pitocin  Labor: early labor Preeclampsia:  no s/s Fetal Wellbeing:  Category I Pain Control:  Fentanyl, requesting epidural I/D:  n/a Anticipated MOD:  NSVD  Marge Duncans 03/05/2013, 5:58 AM

## 2013-03-05 NOTE — Anesthesia Postprocedure Evaluation (Signed)
  Anesthesia Post-op Note  Patient: Katie Malone  Procedure(s) Performed: * No procedures listed *  Patient Location: Mother/Baby  Anesthesia Type:Epidural  Level of Consciousness: awake  Airway and Oxygen Therapy: Patient Spontanous Breathing  Post-op Pain: mild  Post-op Assessment: Patient's Cardiovascular Status Stable and Respiratory Function Stable  Post-op Vital Signs: stable  Complications: No apparent anesthesia complications

## 2013-03-06 ENCOUNTER — Encounter: Payer: Self-pay | Admitting: Family

## 2013-03-06 LAB — CBC
MCH: 32.2 pg (ref 26.0–34.0)
Platelets: 182 10*3/uL (ref 150–400)
RBC: 3.04 MIL/uL — ABNORMAL LOW (ref 3.87–5.11)
WBC: 12.4 10*3/uL — ABNORMAL HIGH (ref 4.0–10.5)

## 2013-03-06 LAB — GC/CHLAMYDIA PROBE AMP
CT Probe RNA: NEGATIVE
GC Probe RNA: NEGATIVE

## 2013-03-06 MED ORDER — RHO D IMMUNE GLOBULIN 1500 UNIT/2ML IJ SOLN
300.0000 ug | Freq: Once | INTRAMUSCULAR | Status: AC
  Administered 2013-03-06: 300 ug via INTRAMUSCULAR
  Filled 2013-03-06: qty 2

## 2013-03-06 NOTE — Progress Notes (Signed)
UR chart review completed.  

## 2013-03-06 NOTE — Progress Notes (Signed)
Post Partum Day 1 Subjective: no complaints, up ad lib, voiding, tolerating PO and Pt states she signed BTL papers in March at [facility] wants BTL before leaving hosp.  Objective: Blood pressure 106/71, pulse 56, temperature 98 F (36.7 C), temperature source Oral, resp. rate 17, height 5\' 2"  (1.575 m), weight 159 lb (72.122 kg), last menstrual period 06/02/2012, SpO2 98.00%, unknown if currently breastfeeding.  Physical Exam:  General: alert, cooperative, appears stated age and no distress Lochia: appropriate Uterine Fundus: firm Incision: n/a DVT Evaluation: No evidence of DVT seen on physical exam. Negative Homan's sign.   Recent Labs  03/04/13 2120 03/06/13 0605  HGB 10.9* 9.8*  HCT 31.0* 28.2*    Assessment/Plan: Plan for discharge tomorrow   LOS: 2 days   Katie Malone 03/06/2013, 7:22 AM

## 2013-03-07 ENCOUNTER — Encounter: Payer: Medicaid Other | Admitting: Obstetrics and Gynecology

## 2013-03-07 LAB — RH IG WORKUP (INCLUDES ABO/RH)
ABO/RH(D): A NEG
Fetal Screen: NEGATIVE
Gestational Age(Wks): 38.2

## 2013-03-07 MED ORDER — SENNOSIDES-DOCUSATE SODIUM 8.6-50 MG PO TABS: 2.0000 | ORAL_TABLET | Freq: Every day | ORAL | Status: DC

## 2013-03-07 MED ORDER — IBUPROFEN 600 MG PO TABS: 600.0000 mg | ORAL_TABLET | Freq: Four times a day (QID) | ORAL | Status: DC

## 2013-03-07 MED ORDER — OXYCODONE-ACETAMINOPHEN 5-325 MG PO TABS: 1.0000 | ORAL_TABLET | ORAL | Status: DC | PRN

## 2013-03-07 NOTE — Discharge Summary (Signed)
Obstetric Discharge Summary Reason for Admission: onset of labor and rupture of membranes Prenatal Procedures: none Intrapartum Procedures: spontaneous vaginal delivery Postpartum Procedures: Rho(D) Ig Complications-Operative and Postpartum: none  Patient is breast and bottle feeding and plans Tubal Ligation for contraception.  Hemoglobin  Date Value Range Status  03/06/2013 9.8* 12.0 - 15.0 g/dL Final  09/17/3084 57.8   Final     HCT  Date Value Range Status  03/06/2013 28.2* 36.0 - 46.0 % Final  10/19/2012 30   Final    Physical Exam:  General: alert, cooperative and no distress Lochia: appropriate Uterine Fundus: firm Incision: n/a DVT Evaluation: No evidence of DVT seen on physical exam. Negative Homan's sign. No cords or calf tenderness. No significant calf/ankle edema.  Discharge Diagnoses: Term Pregnancy-delivered, SVD  Discharge Information: Date: 03/07/2013 Activity: pelvic rest Diet: routine Medications: PNV, Ibuprofen, Colace and Iron Condition: stable Instructions: refer to practice specific booklet Discharge to: home Follow-up Information   Follow up with [facility] In 2 weeks. (You should receive an appt date and time prior to discharge to set up tubal ligation. If you do not hear anything call the number above to schedule.)    Contact information:   [address] [place] [address] [phone]      Newborn Data: Live born female  Birth Weight: 6 lb 11 oz (3033 g) APGAR: 9, 9  Home with mother.  Napoleon Form 03/07/2013, 7:57 AM

## 2013-03-08 LAB — TYPE AND SCREEN: Unit division: 0

## 2013-03-09 ENCOUNTER — Encounter: Payer: Self-pay | Admitting: *Deleted

## 2013-03-09 DIAGNOSIS — Z302 Encounter for sterilization: Secondary | ICD-10-CM

## 2013-04-03 ENCOUNTER — Encounter: Payer: Self-pay | Admitting: Obstetrics & Gynecology

## 2013-04-03 ENCOUNTER — Ambulatory Visit: Payer: Medicaid Other | Admitting: Family

## 2013-04-03 ENCOUNTER — Ambulatory Visit (INDEPENDENT_AMBULATORY_CARE_PROVIDER_SITE_OTHER): Payer: Medicaid Other | Admitting: Obstetrics & Gynecology

## 2013-04-03 DIAGNOSIS — Z01812 Encounter for preprocedural laboratory examination: Secondary | ICD-10-CM

## 2013-04-03 DIAGNOSIS — Z3049 Encounter for surveillance of other contraceptives: Secondary | ICD-10-CM

## 2013-04-03 DIAGNOSIS — Z3009 Encounter for other general counseling and advice on contraception: Secondary | ICD-10-CM

## 2013-04-03 LAB — POCT PREGNANCY, URINE
Preg Test, Ur: NEGATIVE
Preg Test, Ur: NEGATIVE

## 2013-04-03 MED ORDER — MEDROXYPROGESTERONE ACETATE 150 MG/ML IM SUSP
150.0000 mg | INTRAMUSCULAR | Status: DC
  Administered 2013-04-03: 150 mg via INTRAMUSCULAR

## 2013-04-03 NOTE — Patient Instructions (Signed)
Levonorgestrel intrauterine device (IUD) What is this medicine? LEVONORGESTREL IUD (LEE voe nor jes trel) is a contraceptive (birth control) device. The device is placed inside the uterus by a healthcare professional. It is used to prevent pregnancy and can also be used to treat heavy bleeding that occurs during your period. Depending on the device, it can be used for 3 to 5 years. This medicine may be used for other purposes; ask your health care provider or pharmacist if you have questions. What should I tell my health care provider before I take this medicine? They need to know if you have any of these conditions: -abnormal Pap smear -cancer of the breast, uterus, or cervix -diabetes -endometritis -genital or pelvic infection now or in the past -have more than one sexual partner or your partner has more than one partner -heart disease -history of an ectopic or tubal pregnancy -immune system problems -IUD in place -liver disease or tumor -problems with blood clots or take blood-thinners -use intravenous drugs -uterus of unusual shape -vaginal bleeding that has not been explained -an unusual or allergic reaction to levonorgestrel, other hormones, silicone, or polyethylene, medicines, foods, dyes, or preservatives -pregnant or trying to get pregnant -breast-feeding How should I use this medicine? This device is placed inside the uterus by a health care professional. Talk to your pediatrician regarding the use of this medicine in children. Special care may be needed. Overdosage: If you think you have taken too much of this medicine contact a poison control center or emergency room at once. NOTE: This medicine is only for you. Do not share this medicine with others. What if I miss a dose? This does not apply. What may interact with this medicine? Do not take this medicine with any of the following medications: -amprenavir -bosentan -fosamprenavir This medicine may also interact with  the following medications: -aprepitant -barbiturate medicines for inducing sleep or treating seizures -bexarotene -griseofulvin -medicines to treat seizures like carbamazepine, ethotoin, felbamate, oxcarbazepine, phenytoin, topiramate -modafinil -pioglitazone -rifabutin -rifampin -rifapentine -some medicines to treat HIV infection like atazanavir, indinavir, lopinavir, nelfinavir, tipranavir, ritonavir -St. John's wort -warfarin This list may not describe all possible interactions. Give your health care provider a list of all the medicines, herbs, non-prescription drugs, or dietary supplements you use. Also tell them if you smoke, drink alcohol, or use illegal drugs. Some items may interact with your medicine. What should I watch for while using this medicine? Visit your doctor or health care professional for regular check ups. See your doctor if you or your partner has sexual contact with others, becomes HIV positive, or gets a sexual transmitted disease. This product does not protect you against HIV infection (AIDS) or other sexually transmitted diseases. You can check the placement of the IUD yourself by reaching up to the top of your vagina with clean fingers to feel the threads. Do not pull on the threads. It is a good habit to check placement after each menstrual period. Call your doctor right away if you feel more of the IUD than just the threads or if you cannot feel the threads at all. The IUD may come out by itself. You may become pregnant if the device comes out. If you notice that the IUD has come out use a backup birth control method like condoms and call your health care provider. Using tampons will not change the position of the IUD and are okay to use during your period. What side effects may I notice from receiving this medicine?   Side effects that you should report to your doctor or health care professional as soon as possible: -allergic reactions like skin rash, itching or  hives, swelling of the face, lips, or tongue -fever, flu-like symptoms -genital sores -high blood pressure -no menstrual period for 6 weeks during use -pain, swelling, warmth in the leg -pelvic pain or tenderness -severe or sudden headache -signs of pregnancy -stomach cramping -sudden shortness of breath -trouble with balance, talking, or walking -unusual vaginal bleeding, discharge -yellowing of the eyes or skin Side effects that usually do not require medical attention (report to your doctor or health care professional if they continue or are bothersome): -acne -breast pain -change in sex drive or performance -changes in weight -cramping, dizziness, or faintness while the device is being inserted -headache -irregular menstrual bleeding within first 3 to 6 months of use -nausea This list may not describe all possible side effects. Call your doctor for medical advice about side effects. You may report side effects to FDA at 1-800-FDA-1088. Where should I keep my medicine? This does not apply. NOTE: This sheet is a summary. It may not cover all possible information. If you have questions about this medicine, talk to your doctor, pharmacist, or health care provider.  2013, Elsevier/Gold Standard. (09/03/2011 1:54:04 PM) Contraceptive Implant Information A contraceptive implant is a plastic rod that is inserted under the skin. It is usually inserted under the skin of your upper arm. It continually releases small amounts of progestin (synthetic progesterone) into the bloodstream. This prevents an egg from being released from the ovary. It also thickens the cervical mucus to prevent sperm from entering the cervix, and it thins the uterine lining to prevent a fertilized egg from attaching to the uterus. They can be effective for up to 3 years. Implants do not provide protection against sexually transmitted diseases (STDs).  The procedure to insert an implant usually takes about 10 minutes.  There may be minor bruising, swelling, and discomfort at the insertion site for a couple days. The implant begins to work within the first day. Other contraceptive protection should be used for 2 weeks. Follow up with your caregiver to get rechecked as directed. Your caregiver will make sure you are a good candidate for the contraceptive implant. Discuss with your caregiver the possible side effects of the implant ADVANTAGES  It prevents pregnancy for up to 3 years.  It is easily reversible.  It is convenient.  The progestins may protect against uterine and ovarian cancer.  It can be used when breastfeeding.  It can be used by women who cannot take estrogen. DISADVANTAGES  You may have irregular or unplanned vaginal bleeding.  You may develop side effects, including headache, weight gain, acne, breast tenderness, or mood changes.  You may have tissue or nerve damage after insertion (rare).  It may be difficult and uncomfortable to remove.  Certain medications may interfere with the effectiveness of the implants. REMOVAL OF IMPLANT The implant should be removed in 3 years or as directed by your caregiver. The implants effect wears off in a few hours after removal. Your ability to get pregnant (fertility) is restored within a couple of weeks. New implants can be inserted as soon as the old ones are removed if desired. DO NOT GET THE IMPLANT IF:   You are pregnant.  You have a history of breast cancer, osteoporosis, blood clots, heart disease, diabetes, high blood pressure, liver disease, tumors, or stroke.   You have undiagnosed vaginal bleeding.  You  have overly sensitive to certain parts of the implant. Document Released: 07/23/2011 Document Revised: 10/26/2011 Document Reviewed: 07/23/2011 [facility] Patient Information 2014 [place], [place].

## 2013-04-03 NOTE — Progress Notes (Signed)
Patient ID: Katie Malone, female   DOB: 09-19-1975, 37 y.o.   MRN: [id number] Subjective:     Katie Malone is a 37 y.o. female who presents for a postpartum visit. She is 5 weeks postpartum following a spontaneous vaginal delivery. I have fully reviewed the prenatal and intrapartum course.  Outcome: spontaneous vaginal delivery. Anesthesia: epidural. Postpartum course has been uncomlicated. Baby's course has been unremarkable. Baby is feeding by formula. Bleeding no bleeding. Bowel function is normal. Bladder function is normal. Patient is not sexually active. Contraception method is abstinence. Postpartum depression screening: negative. Pt was scheduled for a BTL on the 27th of Aug which she wants to cancel due to work issues.  She is interested in getting the Depo Provera.     The following portions of the patient's history were reviewed and updated as appropriate: allergies, current medications, past family history, past medical history, past social history, past surgical history and problem list.  Review of Systems Pertinent items are noted in HPI.   Objective:    BP 129/82  Pulse 86  Ht 5\' 2"  (1.575 m)  Wt 145 lb 4.8 oz (65.908 kg)  BMI 26.57 kg/m2  Breastfeeding? No  General:  alert and no distress           Abdomen: soft, non-tender; bowel sounds normal; no masses,  no organomegaly   Vulva:  normal  Vagina: normal vagina  Cervix:  no cervical motion tenderness  Corpus: normal size, contour, position, consistency, mobility, non-tender  Adnexa:  no mass, fullness, tenderness  Rectal Exam: Not performed.        Assessment:     6 week postpartum exam. Pap smear not done at today's visit.   Plan:    1. Contraception: will get Depo provera today 2. Reviewed [facility] pt will read literature and f/u 3. Follow up in: 3 month or as needed.

## 2013-04-12 ENCOUNTER — Ambulatory Visit (HOSPITAL_COMMUNITY)
Admission: RE | Admit: 2013-04-12 | Payer: Medicaid Other | Source: Ambulatory Visit | Admitting: Obstetrics & Gynecology

## 2013-04-12 ENCOUNTER — Encounter (HOSPITAL_COMMUNITY): Admission: RE | Payer: Self-pay | Source: Ambulatory Visit

## 2013-04-12 SURGERY — SALPINGECTOMY, BILATERAL, LAPAROSCOPIC
Anesthesia: General | Laterality: Bilateral

## 2013-04-28 ENCOUNTER — Telehealth: Payer: Self-pay | Admitting: General Practice

## 2013-04-28 NOTE — Telephone Encounter (Signed)
Patient called and left message stating she has been having some issues. She had a baby in July and saw [place] in mid August and had no issues with bleeding at that time and received a depo shot at that visit for birth control and 3-4 days later she started bleeding and the bleeding has progressively got worse and has been very heavy bleeding to the point where she is saturating pads and tampons in less than an hour sometimes every 30 minutes. She has also been feeling very dizzy and lightheaded like she is going to pass out and wants to see if she can come in and be seen. Called patient and stated that with the bleeding she is having, especially for some time now she needs to go to [facility] for evaluation. Patient verbalized understanding and asked about still getting an appt here in the clinic. Told patient that if she goes to [facility] and they evaluate her and feel like she needs a follow up with [place], they will notify us so we can get her worked in. Patient verbalized understanding and stated she would be coming in to the ER then. Patient had no further questions

## 2013-05-24 ENCOUNTER — Telehealth: Payer: Self-pay | Admitting: General Practice

## 2013-05-24 NOTE — Telephone Encounter (Signed)
Patient called and left message stating she is calling about her cancelled tubal surgery and the doctor said she could possibly get it done because her papers were good till January and she would like to get this scheduled between right after Christmas and the new year. She is also calling about the right hip pain she is having and she is still bleeding from the depo shot she got on 8/18

## 2013-06-14 NOTE — Telephone Encounter (Signed)
Called patient and she stated that she is still having bleeding since getting her depo shot and that it is heavy at times and reports that she is still having hip pain and was supposed to get pain medication but never did. Patient also states she would like her tubal scheduled before she runs out of time in January. Per chart review patient has called about this heavy bleeding before, was able to work patient in for appointment 10/30 at 3:15. Informed patient of appt and she stated she would be here tomorrow. Patient had no further questions.

## 2013-06-15 ENCOUNTER — Encounter: Payer: Self-pay | Admitting: Obstetrics & Gynecology

## 2013-06-15 ENCOUNTER — Ambulatory Visit (INDEPENDENT_AMBULATORY_CARE_PROVIDER_SITE_OTHER): Payer: Medicaid Other | Admitting: Obstetrics & Gynecology

## 2013-06-15 VITALS — BP 111/71 | HR 102 | Temp 97.8°F | Ht 62.0 in | Wt 145.1 lb

## 2013-06-15 DIAGNOSIS — R5381 Other malaise: Secondary | ICD-10-CM

## 2013-06-15 DIAGNOSIS — R5383 Other fatigue: Secondary | ICD-10-CM

## 2013-06-15 DIAGNOSIS — N926 Irregular menstruation, unspecified: Secondary | ICD-10-CM

## 2013-06-15 DIAGNOSIS — R51 Headache: Secondary | ICD-10-CM

## 2013-06-15 DIAGNOSIS — Z3009 Encounter for other general counseling and advice on contraception: Secondary | ICD-10-CM

## 2013-06-15 LAB — POCT PREGNANCY, URINE: Preg Test, Ur: NEGATIVE

## 2013-06-15 MED ORDER — NORGESTIMATE-ETH ESTRADIOL 0.25-35 MG-MCG PO TABS: 1.0000 | ORAL_TABLET | Freq: Every day | ORAL | Status: DC

## 2013-06-15 NOTE — Progress Notes (Signed)
Patient requests UPT, states she has been eating weird and feeling nauseous.

## 2013-06-15 NOTE — Patient Instructions (Signed)
Sterilization Information, Female Female sterilization is a procedure to permanently prevent pregnancy. There are different ways to perform sterilization, but all either block or close the fallopian tubes so that your eggs cannot reach your uterus. If your egg cannot reach your uterus, sperm cannot fertilize the egg, and you cannot get pregnant.  Sterilization is performed by a surgical procedure. Sometimes these procedures are performed in a hospital while a patient is asleep. Sometimes they can be done in a clinic setting with the patient awake. The fallopian tubes can be surgically cut, tied, or sealed through a procedure called tubal ligation. The fallopian tubes can also be closed with clips or rings. Sterilization can also be done by placing a tiny coil into each fallopian tube, which causes scar tissue to grow inside the tube. The scar tissue then blocks the tubes.  Discuss sterilization with your caregiver to answer any concerns you or your partner may have. You may want to ask what type of sterilization your caregiver performs. Some caregivers may not perform all the various options. Sterilization is permanent and should only be done if you are sure you do not want children or do not want any more children. Having a sterilization reversed may not be successful.  STERILIZATION PROCEDURES  Laparoscopic sterilization. This is a surgical method performed at a time other than right after childbirth. Two incisions are made in the lower abdomen. A thin, lighted tube (laparoscope) is inserted into one of the incisions and is used to perform the procedure. The fallopian tubes are closed with a ring or a clip. An instrument that uses heat could be used to seal the tubes closed (electrocautery).   Mini-laparotomy. This is a surgical method done 1 or 2 days after giving birth. Typically, a small incision is made just below the belly button (umbilicus) and the fallopian tubes are exposed. The tubes can then be  sealed, tied, or cut.   Hysteroscopic sterilization. This is performed at a time other than right after childbirth. A tiny, spring-like coil is inserted through the cervix and uterus and placed into the fallopian tubes. The coil causes scaring and blocks the tubes. Other forms of contraception should be used for 3 months after the procedure to allow the scar tissue to form completely. Additionally, it is required hysterosalpingography be done 3 months later to ensure that the procedure was successful. Hysterosalpingography is a procedure that uses X-rays to look at your uterus and fallopian tubes after a material to make them show up better has been inserted. IS STERILIZATION SAFE? Sterilization is considered safe with very rare complications. Risks depend on the type of procedure you have. As with any surgical procedure, there are risks. Some risks of sterilization by any means include:   Bleeding.  Infection.  Reaction to anesthesia medicine.  Injury to surrounding organs. Risks specific to having hysteroscopic coils placed include:  The coils may not be placed correctly the first time.   The coils may move out of place.   The tubes may not get completely blocked after 3 months.   Injury to surrounding organs when placing the coil.  HOW EFFECTIVE IS FEMALE STERILIZATION? Sterilization is nearly 100% effective, but it can fail. Depending on the type of sterilization, the rate of failure can be as high as 3%. After hysteroscopic sterilization with placement of fallopian tube coils, you will need back-up birth control for 3 months after the procedure. Sterilization is effective for a lifetime.  BENEFITS OF STERILIZATION  It does   not affect your hormones, and therefore will not affect your menstrual periods, sexual desire, or performance.   It is effective for a lifetime.   It is safe.   You do not need to worry about getting pregnant. Keep in mind that if you had the  hysteroscopic placement procedure, you must wait 3 months after the procedure (or until your caregiver confirms) before pregnancy is not considered possible.   There are no side effects unlike other types of birth control (contraception).  DRAWBACKS OF STERILIZATION  You must be sure you do not want children or any more children. The procedure is permanent.   It does not provide protection against sexually transmitted infections (STIs).   The tubes can grow back together. If this happens, there is a risk of pregnancy. There is also an increased risk (50%) of pregnancy being an ectopic pregnancy. This is a pregnancy that happens outside of the uterus. Document Released: 01/20/2008 Document Revised: 02/02/2012 Document Reviewed: 11/19/2011 [employer] Patient Information 2014 [employer].  

## 2013-06-16 NOTE — Progress Notes (Signed)
Subjective:     Patient ID: Katie Malone, female   DOB: 1975-10-31, 37 y.o.   MRN: [id number]  HPI Pt presents with request for sterilization.  She was seen on the past for sterilization request but, was unable to get it done previously due to personal scheduling issues.  She also wanted a pregnancy test today to r/o pregnancy.    Pt also with multiple nonspecific complaints.  She does not have a primary care provider   Review of Systems     Objective:   Physical ExamPt in NAD BP 111/71  Pulse 102  Temp(Src) 97.8 F (36.6 C) (Oral)  Ht 5\' 2"  (1.575 m)  Wt 145 lb 1.6 oz (65.817 kg)  BMI 26.53 kg/m2  LMP 04/05/2013  Breastfeeding? No Exam deferred  UPT: neg      Assessment:     Sterilization request - she had a prior Right salpingectomy for an ectopic pregnancy     Plan:     Sprintec 1 po q day Referral to primary care to establish care  Patient desires surgical management with sterilization with left salpingctomy.  The risks of surgery were discussed in detail with the patient including but not limited to: bleeding which may require transfusion or reoperation; infection which may require prolonged hospitalization or re-hospitalization and antibiotic therapy; injury to bowel, bladder, ureters and major vessels or other surrounding organs; need for additional procedures including laparotomy; thromboembolic phenomenon, incisional problems and other postoperative or anesthesia complications.  Patient was told that the risk of failure is 3-11/998 and that the risk of ectopic pregnancy in the event of a pregnancy is increased; the postoperative expectations were also discussed in detail. The patient also understands the alternative treatment options which were discussed in full. All questions were answered.  She was told that she will be contacted by our surgical scheduler regarding the time and date of her surgery; routine preoperative instructions of having nothing to eat or drink  after midnight on the day prior to surgery and also coming to the hospital 1 1/2 hours prior to her time of surgery were also emphasized.  She was told she may be called for a preoperative appointment about a week prior to surgery and will be given further preoperative instructions at that visit. Printed patient education handouts about the procedure were given to the patient to review at home.

## 2013-06-19 ENCOUNTER — Ambulatory Visit: Payer: Medicaid Other

## 2013-06-20 ENCOUNTER — Encounter: Payer: Self-pay | Admitting: *Deleted

## 2013-06-20 ENCOUNTER — Telehealth: Payer: Self-pay | Admitting: *Deleted

## 2013-06-20 NOTE — Telephone Encounter (Signed)
Pt left message stating she was seen at clinic on 10/30 and received a letter for her job. She needs is to say that she may return to work without restrictions. I called pt and left message that I could not see in her chart where we took her out of work for any medical reason.  She was given the letter on the day of her visit because she was seen at clinic that day.   Please call back with additional details or information.

## 2013-06-20 NOTE — Telephone Encounter (Signed)
Patient needs letter because she missed work because of her appt here. Her office requires a note saying that she is okay to work without restrictions. Letter given to patient.

## 2013-07-19 ENCOUNTER — Encounter (HOSPITAL_COMMUNITY): Payer: Self-pay

## 2013-07-20 ENCOUNTER — Ambulatory Visit: Payer: Medicaid Other | Admitting: Obstetrics & Gynecology

## 2013-07-28 ENCOUNTER — Encounter (HOSPITAL_COMMUNITY): Payer: Self-pay | Admitting: Pharmacist

## 2013-08-01 ENCOUNTER — Encounter (HOSPITAL_COMMUNITY): Payer: Self-pay

## 2013-08-01 ENCOUNTER — Encounter (HOSPITAL_COMMUNITY)
Admission: RE | Admit: 2013-08-01 | Discharge: 2013-08-01 | Disposition: A | Discharge status: Home / Self Care | Payer: Medicaid Other | Source: Ambulatory Visit | Attending: Obstetrics & Gynecology | Admitting: Obstetrics & Gynecology

## 2013-08-01 HISTORY — DX: Adverse effect of unspecified anesthetic, initial encounter: T41.45XA

## 2013-08-01 LAB — CBC
HCT: 36.5 % (ref 36.0–46.0)
Hemoglobin: 12.6 g/dL (ref 12.0–15.0)
MCH: 31.8 pg (ref 26.0–34.0)
MCHC: 34.5 g/dL (ref 30.0–36.0)
Platelets: 239 10*3/uL (ref 150–400)
RDW: 12 % (ref 11.5–15.5)
WBC: 9.9 10*3/uL (ref 4.0–10.5)

## 2013-08-01 NOTE — Pre-Procedure Instructions (Signed)
Patient's stated history of seizures discussed with Dr Malen Gauze, no orders given. Unable to retrieve any notes other than referral to Neurosurgeon (?)

## 2013-08-01 NOTE — Patient Instructions (Signed)
22 Katie Malone  08/01/2013   Your procedure is scheduled on:  08/03/13  Enter through the Main Entrance of [facility] at  10AM.  Pick up the phone at the desk and dial [phone].   Call this number if you have problems the morning of surgery: [phone]   Remember:   Do not eat food:After Midnight.  Do not drink clear liquids: After Midnight.  Take these medicines the morning of surgery with A SIP OF WATER: NA   Do not wear jewelry, make-up or nail polish.  Do not wear lotions, powders, or perfumes. You may wear deodorant.  Do not shave 48 hours prior to surgery.  Do not bring valuables to the hospital.  [facility] is not   responsible for any belongings or valuables brought to the hospital.  Contacts, dentures or bridgework may not be worn into surgery.  Leave suitcase in the car. After surgery it may be brought to your room.  For patients admitted to the hospital, checkout time is 11:00 AM the day of              discharge.   Patients discharged the day of surgery will not be allowed to drive             home.  Name and phone number of your driver: Philipp Ovens  Special Instructions:   Shower using CHG 2 nights before surgery and the night before surgery.  If you shower the day of surgery use CHG.  Use special wash - you have one bottle of CHG for all showers.  You should use approximately 1/3 of the bottle for each shower.   Please read over the following fact sheets that you were given:   Surgical Site Infection Prevention and Care and Recovery After Surgery

## 2013-08-03 ENCOUNTER — Encounter (HOSPITAL_COMMUNITY): Payer: Self-pay | Admitting: Anesthesiology

## 2013-08-03 ENCOUNTER — Ambulatory Visit (HOSPITAL_COMMUNITY)
Admission: RE | Admit: 2013-08-03 | Discharge: 2013-08-03 | Disposition: A | Discharge status: Home / Self Care | Payer: Medicaid Other | Source: Ambulatory Visit | Attending: Obstetrics & Gynecology | Admitting: Obstetrics & Gynecology

## 2013-08-03 ENCOUNTER — Encounter (HOSPITAL_COMMUNITY)
Admission: RE | Disposition: A | Discharge status: Home / Self Care | Payer: Self-pay | Source: Ambulatory Visit | Attending: Obstetrics & Gynecology

## 2013-08-03 ENCOUNTER — Ambulatory Visit (HOSPITAL_COMMUNITY): Payer: Medicaid Other | Admitting: Anesthesiology

## 2013-08-03 ENCOUNTER — Encounter (HOSPITAL_COMMUNITY): Payer: Medicaid Other | Admitting: Anesthesiology

## 2013-08-03 DIAGNOSIS — Z302 Encounter for sterilization: Secondary | ICD-10-CM | POA: Insufficient documentation

## 2013-08-03 HISTORY — PX: LAPAROSCOPIC BILATERAL SALPINGECTOMY: SHX5889

## 2013-08-03 SURGERY — SALPINGECTOMY, BILATERAL, LAPAROSCOPIC
Anesthesia: General | Site: Abdomen | Laterality: Left

## 2013-08-03 MED ORDER — KETOROLAC TROMETHAMINE 30 MG/ML IJ SOLN
INTRAMUSCULAR | Status: AC
  Filled 2013-08-03: qty 1

## 2013-08-03 MED ORDER — ROCURONIUM BROMIDE 100 MG/10ML IV SOLN
INTRAVENOUS | Status: DC | PRN
  Administered 2013-08-03: 30 mg via INTRAVENOUS

## 2013-08-03 MED ORDER — BUPIVACAINE HCL (PF) 0.25 % IJ SOLN
INTRAMUSCULAR | Status: AC
  Filled 2013-08-03: qty 30

## 2013-08-03 MED ORDER — LIDOCAINE HCL (CARDIAC) 20 MG/ML IV SOLN
INTRAVENOUS | Status: AC
  Filled 2013-08-03: qty 5

## 2013-08-03 MED ORDER — PHENYLEPHRINE 40 MCG/ML (10ML) SYRINGE FOR IV PUSH (FOR BLOOD PRESSURE SUPPORT)
PREFILLED_SYRINGE | INTRAVENOUS | Status: AC
  Filled 2013-08-03: qty 5

## 2013-08-03 MED ORDER — FENTANYL CITRATE 0.05 MG/ML IJ SOLN
INTRAMUSCULAR | Status: DC | PRN
  Administered 2013-08-03 (×2): 50 ug via INTRAVENOUS

## 2013-08-03 MED ORDER — PROPOFOL 10 MG/ML IV EMUL
INTRAVENOUS | Status: AC
  Filled 2013-08-03: qty 20

## 2013-08-03 MED ORDER — LIDOCAINE HCL (CARDIAC) 20 MG/ML IV SOLN
INTRAVENOUS | Status: DC | PRN
  Administered 2013-08-03: 70 mg via INTRAVENOUS
  Administered 2013-08-03: 30 mg via INTRAVENOUS

## 2013-08-03 MED ORDER — FENTANYL CITRATE 0.05 MG/ML IJ SOLN
25.0000 ug | INTRAMUSCULAR | Status: DC | PRN
  Administered 2013-08-03: 25 ug via INTRAVENOUS
  Administered 2013-08-03: 50 ug via INTRAVENOUS

## 2013-08-03 MED ORDER — OXYCODONE-ACETAMINOPHEN 5-325 MG PO TABS: 1.0000 | ORAL_TABLET | Freq: Four times a day (QID) | ORAL | Status: DC | PRN

## 2013-08-03 MED ORDER — PROPOFOL 10 MG/ML IV BOLUS
INTRAVENOUS | Status: DC | PRN
  Administered 2013-08-03: 160 mg via INTRAVENOUS

## 2013-08-03 MED ORDER — LACTATED RINGERS IV SOLN
INTRAVENOUS | Status: DC
  Administered 2013-08-03: 10:00:00 via INTRAVENOUS

## 2013-08-03 MED ORDER — NEOSTIGMINE METHYLSULFATE 1 MG/ML IJ SOLN
INTRAMUSCULAR | Status: DC | PRN
  Administered 2013-08-03: 3 mg via INTRAVENOUS

## 2013-08-03 MED ORDER — IBUPROFEN 600 MG PO TABS: 600.0000 mg | ORAL_TABLET | Freq: Four times a day (QID) | ORAL | Status: DC | PRN

## 2013-08-03 MED ORDER — DEXAMETHASONE SODIUM PHOSPHATE 10 MG/ML IJ SOLN
INTRAMUSCULAR | Status: AC
  Filled 2013-08-03: qty 1

## 2013-08-03 MED ORDER — MIDAZOLAM HCL 2 MG/2ML IJ SOLN
INTRAMUSCULAR | Status: DC | PRN
  Administered 2013-08-03: 2 mg via INTRAVENOUS

## 2013-08-03 MED ORDER — OXYCODONE-ACETAMINOPHEN 5-325 MG PO TABS
1.0000 | ORAL_TABLET | Freq: Once | ORAL | Status: AC
  Administered 2013-08-03: 1 via ORAL

## 2013-08-03 MED ORDER — MIDAZOLAM HCL 2 MG/2ML IJ SOLN
INTRAMUSCULAR | Status: AC
  Filled 2013-08-03: qty 2

## 2013-08-03 MED ORDER — ONDANSETRON HCL 4 MG/2ML IJ SOLN
INTRAMUSCULAR | Status: DC | PRN
  Administered 2013-08-03: 4 mg via INTRAVENOUS

## 2013-08-03 MED ORDER — NEOSTIGMINE METHYLSULFATE 1 MG/ML IJ SOLN
INTRAMUSCULAR | Status: AC
  Filled 2013-08-03: qty 1

## 2013-08-03 MED ORDER — OXYCODONE-ACETAMINOPHEN 5-325 MG PO TABS
ORAL_TABLET | ORAL | Status: AC
  Administered 2013-08-03: 1 via ORAL
  Filled 2013-08-03: qty 1

## 2013-08-03 MED ORDER — PHENYLEPHRINE HCL 10 MG/ML IJ SOLN
INTRAMUSCULAR | Status: DC | PRN
  Administered 2013-08-03: 80 ug via INTRAVENOUS
  Administered 2013-08-03: 40 ug via INTRAVENOUS

## 2013-08-03 MED ORDER — KETOROLAC TROMETHAMINE 30 MG/ML IJ SOLN
INTRAMUSCULAR | Status: DC | PRN
  Administered 2013-08-03: 30 mg via INTRAVENOUS

## 2013-08-03 MED ORDER — SODIUM CHLORIDE 0.9 % IJ SOLN
INTRAMUSCULAR | Status: AC
  Filled 2013-08-03: qty 10

## 2013-08-03 MED ORDER — ONDANSETRON HCL 4 MG/2ML IJ SOLN
INTRAMUSCULAR | Status: AC
  Filled 2013-08-03: qty 2

## 2013-08-03 MED ORDER — FENTANYL CITRATE 0.05 MG/ML IJ SOLN
INTRAMUSCULAR | Status: AC
  Filled 2013-08-03: qty 2

## 2013-08-03 MED ORDER — DEXAMETHASONE SODIUM PHOSPHATE 10 MG/ML IJ SOLN
INTRAMUSCULAR | Status: DC | PRN
  Administered 2013-08-03: 10 mg via INTRAVENOUS

## 2013-08-03 MED ORDER — ROCURONIUM BROMIDE 100 MG/10ML IV SOLN
INTRAVENOUS | Status: AC
  Filled 2013-08-03: qty 1

## 2013-08-03 MED ORDER — GLYCOPYRROLATE 0.2 MG/ML IJ SOLN
INTRAMUSCULAR | Status: AC
  Filled 2013-08-03: qty 3

## 2013-08-03 MED ORDER — BUPIVACAINE HCL (PF) 0.25 % IJ SOLN
INTRAMUSCULAR | Status: DC | PRN
  Administered 2013-08-03: 30 mL

## 2013-08-03 MED ORDER — LACTATED RINGERS IV SOLN
INTRAVENOUS | Status: DC
  Administered 2013-08-03: 12:00:00 via INTRAVENOUS

## 2013-08-03 MED ORDER — METOCLOPRAMIDE HCL 5 MG/ML IJ SOLN: 10.0000 mg | Freq: Once | INTRAMUSCULAR | Status: DC | PRN

## 2013-08-03 MED ORDER — MEPERIDINE HCL 25 MG/ML IJ SOLN: 6.2500 mg | INTRAMUSCULAR | Status: DC | PRN

## 2013-08-03 MED ORDER — GLYCOPYRROLATE 0.2 MG/ML IJ SOLN
INTRAMUSCULAR | Status: DC | PRN
  Administered 2013-08-03: 0.6 mg via INTRAVENOUS

## 2013-08-03 SURGICAL SUPPLY — 29 items
CABLE HIGH FREQUENCY MONO STRZ (ELECTRODE) IMPLANT
CATH ROBINSON RED A/P 16FR (CATHETERS) ×2 IMPLANT
CHLORAPREP W/TINT 26ML (MISCELLANEOUS) ×2 IMPLANT
CLOTH BEACON ORANGE TIMEOUT ST (SAFETY) ×2 IMPLANT
DRSG COVADERM PLUS 2X2 (GAUZE/BANDAGES/DRESSINGS) ×2 IMPLANT
GLOVE BIO SURGEON STRL SZ7 (GLOVE) ×6 IMPLANT
GLOVE BIOGEL PI IND STRL 7.0 (GLOVE) ×3 IMPLANT
GLOVE BIOGEL PI INDICATOR 7.0 (GLOVE) ×3
GOWN PREVENTION PLUS LG XLONG (DISPOSABLE) ×4 IMPLANT
GOWN PREVENTION PLUS XLARGE (GOWN DISPOSABLE) ×2 IMPLANT
MANIPULATOR UTERINE 4.5 ZUMI (MISCELLANEOUS) ×2 IMPLANT
NEEDLE INSUFFLATION 120MM (ENDOMECHANICALS) ×2 IMPLANT
NS IRRIG 1000ML POUR BTL (IV SOLUTION) ×2 IMPLANT
PACK LAPAROSCOPY BASIN (CUSTOM PROCEDURE TRAY) ×2 IMPLANT
POUCH SPECIMEN RETRIEVAL 10MM (ENDOMECHANICALS) IMPLANT
PROTECTOR NERVE ULNAR (MISCELLANEOUS) ×2 IMPLANT
SCALPEL HARMONIC ACE (MISCELLANEOUS) ×2 IMPLANT
SET IRRIG TUBING LAPAROSCOPIC (IRRIGATION / IRRIGATOR) IMPLANT
STRIP CLOSURE SKIN 1/2X4 (GAUZE/BANDAGES/DRESSINGS) IMPLANT
STRIP CLOSURE SKIN 1/4X3 (GAUZE/BANDAGES/DRESSINGS) ×2 IMPLANT
SUT VIC AB 3-0 X1 27 (SUTURE) ×2 IMPLANT
SUT VICRYL 0 UR6 27IN ABS (SUTURE) ×4 IMPLANT
SUT VICRYL 4-0 PS2 18IN ABS (SUTURE) ×2 IMPLANT
TOWEL OR 17X24 6PK STRL BLUE (TOWEL DISPOSABLE) ×4 IMPLANT
TRAY FOLEY CATH 14FR (SET/KITS/TRAYS/PACK) IMPLANT
TROCAR BALLN 12MMX100 BLUNT (TROCAR) IMPLANT
TROCAR OPTI TIP 5M 100M (ENDOMECHANICALS) IMPLANT
TROCAR XCEL DIL TIP R 11M (ENDOMECHANICALS) ×2 IMPLANT
WATER STERILE IRR 1000ML POUR (IV SOLUTION) ×2 IMPLANT

## 2013-08-03 NOTE — H&P (Signed)
  Patient ID: Katie Malone, female DOB: Oct 05, 1975, 37 y.o. MRN: [id number]  HPI Pt presents with request for sterilization. She understand that a sterilization in permanent. Pt also with multiple nonspecific complaints.   Past Medical History  Diagnosis Date  . Anemia   . Headache(784.0)     Migraines   . Abnormal Pap smear     Needs colposcopy after delivery  . Complication of anesthesia     seizure immediately after epidural started  . Seizures     july 2014   Past Surgical History  Procedure Laterality Date  . Wisdom tooth extraction    . Laparoscopy  11/05/2011    Procedure: LAPAROSCOPY OPERATIVE;  Surgeon: Jerene Bears, MD;  Location: [facility];  Service: Gynecology;  Laterality: N/A;  Right Salpingectomy  . Laparoscopic salpingo oopherectomy      Right    No current facility-administered medications on file prior to encounter.   No current outpatient prescriptions on file prior to encounter.   History   Social History  . Marital Status: Legally Separated    Spouse Name: N/A    Number of Children: N/A  . Years of Education: N/A   Occupational History  . Not on file.   Social History Main Topics  . Smoking status: Current Every Day Smoker -- 0.50 packs/day    Types: Cigarettes  . Smokeless tobacco: Never Used  . Alcohol Use: No  . Drug Use: No  . Sexual Activity: Yes    Birth Control/ Protection: Pill   Other Topics Concern  . Not on file   Social History Narrative  . No narrative on file   Family History  Problem Relation Age of Onset  . Heart disease Mother   . Hypertension Mother       Review of Systems   Objective:   Physical ExamPt in NAD  BP 113/73  Pulse 87  Temp(Src) 98.1 F (36.7 C) (Oral)  Resp 18  SpO2 99% Exam deferred  UPT: neg   Assessment:   Sterilization request - she had a prior Right salpingectomy for an ectopic pregnancy for Left salpingectomy today  Plan:   Patient desires surgical management with left salpingectomy for  sterilization.  The risks of surgery were discussed in detail with the patient including but not limited to: bleeding which may require transfusion or reoperation; infection which may require prolonged hospitalization or re-hospitalization and antibiotic therapy; injury to bowel, bladder, ureters and major vessels or other surrounding organs; need for additional procedures including laparotomy; thromboembolic phenomenon, incisional problems and other postoperative or anesthesia complications.  Patient was told that the likelihood that her condition and symptoms will be treated effectively with this surgical management was very high; the postoperative expectations were also discussed in detail. The patient also understands the alternative treatment options which were discussed in full. All questions were answered.

## 2013-08-03 NOTE — Anesthesia Preprocedure Evaluation (Signed)
Anesthesia Evaluation  Patient identified by MRN, date of birth, ID band Patient awake    Reviewed: Allergy & Precautions, H&P , NPO status , Patient's Chart, lab work & pertinent test results  History of Anesthesia Complications (+) history of anesthetic complications  Airway Mallampati: II TM Distance: >3 FB Neck ROM: Full    Dental no notable dental hx. (+) Teeth Intact   Pulmonary Current Smoker,  breath sounds clear to auscultation  Pulmonary exam normal       Cardiovascular negative cardio ROS  Rhythm:Regular Rate:Normal     Neuro/Psych  Headaches, Seizures -, Well Controlled,  negative psych ROS   GI/Hepatic negative GI ROS, Neg liver ROS,   Endo/Other  negative endocrine ROS  Renal/GU negative Renal ROS  negative genitourinary   Musculoskeletal negative musculoskeletal ROS (+)   Abdominal   Peds  Hematology  (+) anemia ,   Anesthesia Other Findings   Reproductive/Obstetrics Desires permanent contraception                           Anesthesia Physical Anesthesia Plan  ASA: II  Anesthesia Plan: General   Post-op Pain Management:    Induction: Intravenous  Airway Management Planned: Oral ETT  Additional Equipment:   Intra-op Plan:   Post-operative Plan: Extubation in OR  Informed Consent: I have reviewed the patients History and Physical, chart, labs and discussed the procedure including the risks, benefits and alternatives for the proposed anesthesia with the patient or authorized representative who has indicated his/her understanding and acceptance.   Dental advisory given  Plan Discussed with: CRNA, Anesthesiologist and Surgeon  Anesthesia Plan Comments:         Anesthesia Quick Evaluation

## 2013-08-03 NOTE — Brief Op Note (Signed)
08/03/2013  10:56 AM  PATIENT:  Katie Malone  37 y.o. female  PRE-OPERATIVE DIAGNOSIS:   Undesired fertility  POST-OPERATIVE DIAGNOSIS:   Undesired fertility  PROCEDURE:  Procedure(s): LEFT SALPINGECTOMY (Left)  SURGEON:  Surgeon(s) and Role:    * Willodean Rosenthal, MD - Primary    * Reva Bores, MD - Assisting  ANESTHESIA:   general  EBL:  Total I/O In: -  Out: 100 [Urine:100]  BLOOD ADMINISTERED:none  DRAINS: none   LOCAL MEDICATIONS USED:  MARCAINE     SPECIMEN:  Source of Specimen:  left fallopian tube  DISPOSITION OF SPECIMEN:  PATHOLOGY  COUNTS:  YES  TOURNIQUET:  * No tourniquets in log *  DICTATION: .Note written in EPIC  PLAN OF CARE: Discharge to home after PACU  PATIENT DISPOSITION:  PACU - hemodynamically stable.   Delay start of Pharmacological VTE agent (>24hrs) due to surgical blood loss or risk of bleeding: not applicable

## 2013-08-03 NOTE — Transfer of Care (Signed)
Immediate Anesthesia Transfer of Care Note  Patient: Katie Malone  Procedure(s) Performed: Procedure(s): LEFT SALPINGECTOMY (Left)  Patient Location: PACU  Anesthesia Type:General  Level of Consciousness: awake, oriented and patient cooperative  Airway & Oxygen Therapy: Patient Spontanous Breathing and Patient connected to nasal cannula oxygen  Post-op Assessment: Report given to PACU RN and Post -op Vital signs reviewed and stable  Post vital signs: Reviewed and stable  Complications: No apparent anesthesia complications

## 2013-08-03 NOTE — Op Note (Signed)
Katie Malone PROCEDURE DATE: 08/03/2013  PREOPERATIVE DIAGNOSIS:  Undesired fertility  POSTOPERATIVE DIAGNOSIS:  Undesired fertility  PROCEDURE:  Procedure(s): LEFT SALPINGECTOMY (Left)  SURGEON:  Surgeon(s) and Role:    * Willodean Rosenthal, MD - Primary    * Reva Bores, MD - Assisting  ANESTHESIA:   general  EBL:  Total I/O In: -  Out: 100 [Urine:100]  BLOOD ADMINISTERED:none  DRAINS: none   LOCAL MEDICATIONS USED:  MARCAINE     SPECIMEN:  Source of Specimen:  left fallopian tube  DISPOSITION OF SPECIMEN:  PATHOLOGY  COUNTS:  YES  TOURNIQUET:  * No tourniquets in log *  DICTATION: .Note written in EPIC  PLAN OF CARE: Discharge to home after PACU  PATIENT DISPOSITION:  PACU - hemodynamically stable.   Delay start of Pharmacological VTE agent (>24hrs) due to surgical blood loss or risk of bleeding: not applicable  IINDICATIONS: 37 y.o. [id number] with undesired fertility, desires permanent sterilization. Other reversible forms of contraception were discussed with patient; she declines all other modalities.  Risks of procedure discussed with patient including permanence of method, risk of regret, bleeding, infection, injury to surrounding organs and need for additional procedures including laparotomy.  Failure risk less than 0.5% with increased risk of ectopic gestation if pregnancy occurs was also discussed with patient.  Written informed consent was obtained.    FINDINGS:  Normal uterus, normal left fallopian tube, right fallopian tube previously removed, and ovaries.  TECHNIQUE:  The patient was taken to the operating room where general anesthesia was obtained without difficulty.  She was then placed in the dorsal lithotomy position and prepared and draped in sterile fashion.  After an adequate timeout was performed, a bivalved speculum was then placed in the patient's vagina, and the anterior lip of cervix grasped with the single-tooth tenaculum.  The uterine  manipulator was then advanced into the uterus.  The speculum was removed from the vagina.  Attention was then turned to the patient's abdomen where a 5-mm skin incision was made above the umbilicus.  A 5-mm trocar and sleeve were then advanced without difficulty with the laparoscope under direct visualization into the abdomen.  The abdomen was then insufflated with carbon dioxide gas.  Adequate pneumoperitoneum was obtained.  A survey of the patient's pelvis and abdomen revealed the findings above. A 5-mm lower quadrant port  was placed under direct visualization in the right lower quadrant.  The fallopian tube was transected from the uterine attachments and the underlying mesosalpinx with the Harmonic scalpel allowing for salpingectomy.  The fallopian tube was then removed from the abdomen under direct visualization.  The operative site was surveyed, and it was found to be hemostatic.   No intraoperative injury to other surrounding organs was noted.  The abdomen was desufflated and all instruments were then removed from the patient's abdomen. The  incisions were closed with 4-0 vicryl. 30cc of .25% Marcaine was injected into the port sites. The uterine manipulator was removed from the vagina without complications. The patient tolerated the procedure well.  Sponge, lap, and needle counts were correct times two.  The patient was then taken to the recovery room awake, extubated and in stable condition.  The patient will be discharged to home as per PACU criteria.  Routine postoperative instructions given.  She was prescribed Percocet and Ibuprofen.

## 2013-08-03 NOTE — Anesthesia Postprocedure Evaluation (Signed)
  Anesthesia Post-op Note  Patient: Katie Malone  Procedure(s) Performed: Procedure(s): LEFT SALPINGECTOMY (Left)  Patient Location: PACU  Anesthesia Type:General  Level of Consciousness: awake, alert  and oriented  Airway and Oxygen Therapy: Patient Spontanous Breathing  Post-op Pain: mild  Post-op Assessment: Post-op Vital signs reviewed, Patient's Cardiovascular Status Stable, Respiratory Function Stable, Patent Airway, No signs of Nausea or vomiting and Pain level controlled  Post-op Vital Signs: Reviewed and stable  Complications: No apparent anesthesia complications

## 2013-08-04 ENCOUNTER — Encounter (HOSPITAL_COMMUNITY): Payer: Self-pay | Admitting: Obstetrics & Gynecology

## 2013-08-07 ENCOUNTER — Telehealth: Payer: Self-pay | Admitting: *Deleted

## 2013-08-07 NOTE — Telephone Encounter (Signed)
Spoke with patient concerning pain medication request.  Pt states Motrin is helping cramps but not the incisional pain.  Pt denies any fever, bleeding at site, drainage.  Educated if any of these signs/symptoms to go to MAU.  Encouraged patient to ambulate to move air in the belly.  Pt verbalizes understanding.

## 2013-08-07 NOTE — Telephone Encounter (Signed)
Pt called nurse line stating she had a tubal ligation on Thursday, in a lot of pain. States percocet is not working and is having difficulty breathing. Requesting call back.

## 2013-08-14 ENCOUNTER — Encounter: Payer: Self-pay | Admitting: Obstetrics & Gynecology

## 2013-08-14 ENCOUNTER — Ambulatory Visit (INDEPENDENT_AMBULATORY_CARE_PROVIDER_SITE_OTHER): Payer: Medicaid Other | Admitting: Obstetrics & Gynecology

## 2013-08-14 ENCOUNTER — Telehealth: Payer: Self-pay

## 2013-08-14 VITALS — BP 105/64 | HR 91 | Ht 62.0 in | Wt 149.8 lb

## 2013-08-14 DIAGNOSIS — Z09 Encounter for follow-up examination after completed treatment for conditions other than malignant neoplasm: Secondary | ICD-10-CM

## 2013-08-14 DIAGNOSIS — Z9889 Other specified postprocedural states: Secondary | ICD-10-CM

## 2013-08-14 NOTE — Progress Notes (Signed)
Had BTL on 12/18. Went back to work 08/08/13. On Friday bumped incision and it started bleeding, was sent home from work and not allowed to return until seen by MD. Katie Malone fine now. Took ibuprofen and that helped pain.

## 2013-08-14 NOTE — Telephone Encounter (Signed)
Pt. Called stating she had an incident at work last Friday in which she bumped her incision from BTL and it started to bleed. Pt's work will not let her return without a note stating she can return to work with no restrictions. Pt. States her incision is healing well and has no issues today. Spoke to Dr. Erin Fulling who states she will see patient if she comes in today. Pt. States she is on her way.

## 2013-08-14 NOTE — Progress Notes (Signed)
Pt.'s incision is healing well; slight bruising at belly button with small scab. Incision on right lower abdomen slightly bruised, closed. No s/s of infection. Pt. Denies pain, bleeding or any problems. Per discussion with Dr. Erin Fulling, pt. Can return to work without any restrictions. Letter written and given to patient.

## 2013-08-29 ENCOUNTER — Encounter: Payer: Self-pay | Admitting: *Deleted

## 2013-08-31 ENCOUNTER — Ambulatory Visit: Payer: Medicaid Other | Admitting: Obstetrics & Gynecology

## 2013-09-13 ENCOUNTER — Encounter: Payer: Self-pay | Admitting: *Deleted

## 2013-11-29 ENCOUNTER — Emergency Department (HOSPITAL_COMMUNITY)
Admission: EM | Admit: 2013-11-29 | Discharge: 2013-11-30 | Disposition: A | Discharge status: Home / Self Care | Payer: Medicaid Other | Attending: Emergency Medicine | Admitting: Emergency Medicine

## 2013-11-29 ENCOUNTER — Emergency Department (HOSPITAL_COMMUNITY): Payer: Medicaid Other

## 2013-11-29 ENCOUNTER — Encounter (HOSPITAL_COMMUNITY): Payer: Self-pay | Admitting: Emergency Medicine

## 2013-11-29 DIAGNOSIS — Y92009 Unspecified place in unspecified non-institutional (private) residence as the place of occurrence of the external cause: Secondary | ICD-10-CM | POA: Insufficient documentation

## 2013-11-29 DIAGNOSIS — W108XXA Fall (on) (from) other stairs and steps, initial encounter: Secondary | ICD-10-CM | POA: Insufficient documentation

## 2013-11-29 DIAGNOSIS — W1809XA Striking against other object with subsequent fall, initial encounter: Secondary | ICD-10-CM | POA: Insufficient documentation

## 2013-11-29 DIAGNOSIS — Z8679 Personal history of other diseases of the circulatory system: Secondary | ICD-10-CM | POA: Insufficient documentation

## 2013-11-29 DIAGNOSIS — Z862 Personal history of diseases of the blood and blood-forming organs and certain disorders involving the immune mechanism: Secondary | ICD-10-CM | POA: Insufficient documentation

## 2013-11-29 DIAGNOSIS — S42001A Fracture of unspecified part of right clavicle, initial encounter for closed fracture: Secondary | ICD-10-CM

## 2013-11-29 DIAGNOSIS — Y9301 Activity, walking, marching and hiking: Secondary | ICD-10-CM | POA: Insufficient documentation

## 2013-11-29 DIAGNOSIS — S42023A Displaced fracture of shaft of unspecified clavicle, initial encounter for closed fracture: Secondary | ICD-10-CM | POA: Insufficient documentation

## 2013-11-29 DIAGNOSIS — F17200 Nicotine dependence, unspecified, uncomplicated: Secondary | ICD-10-CM | POA: Insufficient documentation

## 2013-11-29 DIAGNOSIS — Z8669 Personal history of other diseases of the nervous system and sense organs: Secondary | ICD-10-CM | POA: Insufficient documentation

## 2013-11-29 HISTORY — DX: Fracture of unspecified part of right clavicle, initial encounter for closed fracture: S42.001A

## 2013-11-29 MED ORDER — OXYCODONE-ACETAMINOPHEN 5-325 MG PO TABS: 1.0000 | ORAL_TABLET | ORAL | Status: DC | PRN

## 2013-11-29 MED ORDER — HYDROMORPHONE HCL PF 1 MG/ML IJ SOLN
1.0000 mg | Freq: Once | INTRAMUSCULAR | Status: AC
  Administered 2013-11-29: 1 mg via INTRAVENOUS
  Filled 2013-11-29: qty 1

## 2013-11-29 MED ORDER — KETOROLAC TROMETHAMINE 30 MG/ML IJ SOLN
30.0000 mg | Freq: Once | INTRAMUSCULAR | Status: AC
  Administered 2013-11-29: 30 mg via INTRAVENOUS
  Filled 2013-11-29: qty 1

## 2013-11-29 MED ORDER — ONDANSETRON HCL 4 MG/2ML IJ SOLN
4.0000 mg | Freq: Once | INTRAMUSCULAR | Status: AC
  Administered 2013-11-29: 4 mg via INTRAVENOUS
  Filled 2013-11-29: qty 2

## 2013-11-29 MED ORDER — DOCUSATE SODIUM 100 MG PO CAPS: 100.0000 mg | ORAL_CAPSULE | Freq: Two times a day (BID) | ORAL | Status: DC

## 2013-11-29 MED ORDER — IBUPROFEN 600 MG PO TABS: 600.0000 mg | ORAL_TABLET | Freq: Four times a day (QID) | ORAL | Status: DC | PRN

## 2013-11-29 NOTE — Progress Notes (Signed)
Orthopedic Tech Progress Note Patient Details:  Alcide GoodnessMarcia T Malone June 19, 1976 [id number]  Ortho Devices Type of Ortho Device: Sling immobilizer   Katie FlirtCorey M [name] 11/29/2013, 11:34 PM

## 2013-11-29 NOTE — ED Notes (Signed)
Pt. slipped on wet floor hit her right shoulder against a rail this evening  , no LOC / ambulatory , reports pain / swelling at right shoulder . Mild low back pain .

## 2013-11-29 NOTE — ED Provider Notes (Signed)
TIME SEEN: 11:10 PM  CHIEF COMPLAINT: Fall, right shoulder injury  HPI: Patient is a 38 year old female with a history of migraines, seizures who presents emergency Department with mechanical fall and right shoulder injury. She is right-hand dominant. She states she was walking down the steps to check the mail in her bedroom slippers when she slipped. She tried to grab the rail with her arm because it was wet she slipped off this as well leaning on her right shoulder. She did not hit her head or lose consciousness. She denies any preceding symptoms that led to her fall such as chest pain, shortness of breath, palpitations, dizziness. No numbness, tingling or focal weakness. She is complaining of right shoulder pain. She is not on anticoagulation.  ROS: See HPI Constitutional: no fever  Eyes: no drainage  ENT: no runny nose   Cardiovascular:  no chest pain  Resp: no SOB  GI: no vomiting GU: no dysuria Integumentary: no rash  Allergy: no hives  Musculoskeletal: no leg swelling  Neurological: no slurred speech ROS otherwise negative  PAST MEDICAL HISTORY/PAST SURGICAL HISTORY:  Past Medical History  Diagnosis Date  . Anemia   . Headache(784.0)     Migraines   . Abnormal Pap smear     Needs colposcopy after delivery  . Complication of anesthesia     seizure immediately after epidural started  . Seizures     july 2014    MEDICATIONS:  Prior to Admission medications   Medication Sig Start Date End Date Taking? Authorizing Provider  acetaminophen (TYLENOL) 325 MG tablet Take 650 mg by mouth every 6 (six) hours as needed for moderate pain.   Yes Historical Provider, MD    ALLERGIES:  No Known Allergies  SOCIAL HISTORY:  History  Substance Use Topics  . Smoking status: Current Every Day Smoker -- 0.50 packs/day    Types: Cigarettes  . Smokeless tobacco: Never Used  . Alcohol Use: No    FAMILY HISTORY: Family History  Problem Relation Age of Onset  . Heart disease Mother    . Hypertension Mother     EXAM: BP 132/75  Pulse 85  Temp(Src) 98.2 F (36.8 C) (Oral)  Resp 16  SpO2 99%  LMP 10/18/2013 CONSTITUTIONAL: Alert and oriented and responds appropriately to questions. Well-appearing; well-nourished; GCS 15 tearful HEAD: Normocephalic; atraumatic EYES: Conjunctivae clear, PERRL, EOMI ENT: normal nose; no rhinorrhea; moist mucous membranes; pharynx without lesions noted; no dental injury; no septal hematoma NECK: Supple, no meningismus, no LAD; no midline spinal tenderness, step-off or deformity CARD: RRR; S1 and S2 appreciated; no murmurs, no clicks, no rubs, no gallops RESP: Normal chest excursion without splinting or tachypnea; breath sounds clear and equal bilaterally; no wheezes, no rhonchi, no rales; chest wall stable, nontender to palpation ABD/GI: Normal bowel sounds; non-distended; soft, non-tender, no rebound, no guarding PELVIS:  stable, nontender to palpation BACK:  The back appears normal and is non-tender to palpation, there is no CVA tenderness; no midline spinal tenderness, step-off or deformity EXT: Patient is tender to palpation over the mid right clavicle with associated swelling, no ecchymosis, no loss of fullness of the right shoulder, with normal grip strength bilaterally, 2+ radial pulses bilaterally, sensation to light touch intact diffusely, decreased range of motion in the right shoulder secondary to pain the patient is able to reach across her chest and touch her other shoulder, otherwise Normal ROM in all joints; no tenderness of her right hand, right wrist, right forearm or right humerus;  otherwise extremities are non-tender to palpation; no edema; normal capillary refill; no cyanosis    SKIN: Normal color for age and race; warm NEURO: Moves all extremities equally; cranial 2 through 12 intact, sensation to light touch intact diffusely PSYCH: The patient's mood and manner are appropriate. Grooming and personal hygiene are  appropriate.  MEDICAL DECISION MAKING: Patient here with mechanical fall. She has a mid clavicle fracture with shortening of almost 2 cm. Will place patient on sliding and discharge with orthopedic followup. We'll discharge with prescription for Percocet. Have given supportive care instructions and return precautions. Patient verbalizes understanding and is comfortable with plan.      Layla MawKristen N Lucero Auzenne, DO 11/29/13 [id number]

## 2013-11-29 NOTE — ED Notes (Signed)
Patient transported to X-ray 

## 2013-11-29 NOTE — ED Notes (Signed)
Family at bedside. 

## 2013-11-29 NOTE — Discharge Instructions (Signed)
Clavicle Fracture A clavicle fracture is a break in the collarbone. This is a common injury, especially in children. Collarbones do not harden until around the age of 36. Most collarbone fractures are treated with a simple arm sling. In some cases a figure-of-eight splint is used to help hold the broken bones in position. Although not often needed, surgery may be required if the bone fragments are not in the correct position (displaced).  HOME CARE INSTRUCTIONS   Apply ice to the injury for 15-20 minutes each hour while awake for 2 days. Put the ice in a plastic bag and place a towel between the bag of ice and your skin.  Wear the sling or splint constantly for as long as directed by your caregiver. You may remove the sling or splint for bathing or showering. Be sure to keep your shoulder in the same place as when the sling or splint is on. Do not lift your arm.  If a figure-of-eight splint is applied, it must be tightened by another person every day. Tighten it enough to keep the shoulders held back. Allow enough room to place the index finger between the body and strap. Loosen the splint immediately if you feel numbness or tingling in your hands.  Only take over-the-counter or prescription medicines for pain, discomfort, or fever as directed by your caregiver.  Avoid activities that irritate or increase the pain for 4 to 6 weeks after surgery.  Follow all instructions for follow-up with your caregiver. This includes any referrals, physical therapy, and rehabilitation. Any delay in obtaining necessary care could result in a delay or failure of the injury to heal properly. SEEK MEDICAL CARE IF:  You have pain and swelling that are not relieved with medications. SEEK IMMEDIATE MEDICAL CARE IF:  Your arm is numb, cold, or pale, even when the splint is loose. MAKE SURE YOU:   Understand these instructions.  Will watch your condition.  Will get help right away if you are not doing well or get  worse. Document Released: 05/13/2005 Document Revised: 10/26/2011 Document Reviewed: 03/08/2008 [facility] Patient Information 2014 [place], [place]. RICE: Routine Care for Injuries The routine care of many injuries includes Rest, Ice, Compression, and Elevation (RICE). HOME CARE INSTRUCTIONS  Rest is needed to allow your body to heal. Routine activities can usually be resumed when comfortable. Injured tendons and bones can take up to 6 weeks to heal. Tendons are the cord-like structures that attach muscle to bone.  Ice following an injury helps keep the swelling down and reduces pain.  Put ice in a plastic bag.  Place a towel between your skin and the bag.  Leave the ice on for 15-20 minutes, 03-04 times a day. Do this while awake, for the first 24 to 48 hours. After that, continue as directed by your caregiver.  Compression helps keep swelling down. It also gives support and helps with discomfort. If an elastic bandage has been applied, it should be removed and reapplied every 3 to 4 hours. It should not be applied tightly, but firmly enough to keep swelling down. Watch fingers or toes for swelling, bluish discoloration, coldness, numbness, or excessive pain. If any of these problems occur, remove the bandage and reapply loosely. Contact your caregiver if these problems continue.  Elevation helps reduce swelling and decreases pain. With extremities, such as the arms, hands, legs, and feet, the injured area should be placed near or above the level of the heart, if possible. SEEK IMMEDIATE MEDICAL CARE IF:  You have persistent pain and swelling.  You develop redness, numbness, or unexpected weakness.  Your symptoms are getting worse rather than improving after several days. These symptoms may indicate that further evaluation or further X-rays are needed. Sometimes, X-rays may not show a small broken bone (fracture) until 1 week or 10 days later. Make a follow-up appointment with your  caregiver. Ask when your X-ray results will be ready. Make sure you get your X-ray results. Document Released: 11/15/2000 Document Revised: 10/26/2011 Document Reviewed: 01/02/2011 [facility] Patient Information 2014 [place]Care, [place]LL[place].   Narrative: CLINICAL DATA: Larey SeatFell. Pain.  EXAM: RIGHT SHOULDER - 2+ VIEW  COMPARISON: None.  FINDINGS: There is a complete fracture through the midportion of the clavicle with foreshortening of almost 2 cm. Regional ribs appear normal. Glenohumeral joint appears normal. Scapula appears intact.  IMPRESSION: Mid clavicle fracture with overriding of 2 cm.

## 2013-12-01 ENCOUNTER — Encounter (HOSPITAL_BASED_OUTPATIENT_CLINIC_OR_DEPARTMENT_OTHER): Payer: Self-pay | Admitting: *Deleted

## 2013-12-04 ENCOUNTER — Ambulatory Visit (HOSPITAL_BASED_OUTPATIENT_CLINIC_OR_DEPARTMENT_OTHER): Payer: Medicaid Other | Admitting: Anesthesiology

## 2013-12-04 ENCOUNTER — Encounter (HOSPITAL_BASED_OUTPATIENT_CLINIC_OR_DEPARTMENT_OTHER): Payer: Self-pay

## 2013-12-04 ENCOUNTER — Ambulatory Visit (HOSPITAL_COMMUNITY): Payer: Medicaid Other

## 2013-12-04 ENCOUNTER — Encounter (HOSPITAL_BASED_OUTPATIENT_CLINIC_OR_DEPARTMENT_OTHER): Payer: Medicaid Other | Admitting: Anesthesiology

## 2013-12-04 ENCOUNTER — Ambulatory Visit (HOSPITAL_BASED_OUTPATIENT_CLINIC_OR_DEPARTMENT_OTHER)
Admission: RE | Admit: 2013-12-04 | Discharge: 2013-12-04 | Disposition: A | Discharge status: Home / Self Care | Payer: Medicaid Other | Source: Ambulatory Visit | Attending: Orthopedic Surgery | Admitting: Orthopedic Surgery

## 2013-12-04 ENCOUNTER — Encounter (HOSPITAL_BASED_OUTPATIENT_CLINIC_OR_DEPARTMENT_OTHER)
Admission: RE | Disposition: A | Discharge status: Home / Self Care | Payer: Self-pay | Source: Ambulatory Visit | Attending: Orthopedic Surgery

## 2013-12-04 DIAGNOSIS — F17200 Nicotine dependence, unspecified, uncomplicated: Secondary | ICD-10-CM | POA: Insufficient documentation

## 2013-12-04 DIAGNOSIS — J4489 Other specified chronic obstructive pulmonary disease: Secondary | ICD-10-CM | POA: Insufficient documentation

## 2013-12-04 DIAGNOSIS — Y92009 Unspecified place in unspecified non-institutional (private) residence as the place of occurrence of the external cause: Secondary | ICD-10-CM | POA: Insufficient documentation

## 2013-12-04 DIAGNOSIS — J449 Chronic obstructive pulmonary disease, unspecified: Secondary | ICD-10-CM | POA: Insufficient documentation

## 2013-12-04 DIAGNOSIS — D649 Anemia, unspecified: Secondary | ICD-10-CM | POA: Insufficient documentation

## 2013-12-04 DIAGNOSIS — W1789XA Other fall from one level to another, initial encounter: Secondary | ICD-10-CM | POA: Insufficient documentation

## 2013-12-04 DIAGNOSIS — G40909 Epilepsy, unspecified, not intractable, without status epilepticus: Secondary | ICD-10-CM | POA: Insufficient documentation

## 2013-12-04 DIAGNOSIS — S42023A Displaced fracture of shaft of unspecified clavicle, initial encounter for closed fracture: Secondary | ICD-10-CM | POA: Insufficient documentation

## 2013-12-04 HISTORY — DX: Conversion disorder with seizures or convulsions: F44.5

## 2013-12-04 HISTORY — DX: Personal history of diseases of the blood and blood-forming organs and certain disorders involving the immune mechanism: Z86.2

## 2013-12-04 HISTORY — PX: ORIF CLAVICULAR FRACTURE: SHX5055

## 2013-12-04 HISTORY — DX: Unspecified convulsions: R56.9

## 2013-12-04 HISTORY — DX: Fracture of unspecified part of right clavicle, initial encounter for closed fracture: S42.001A

## 2013-12-04 HISTORY — DX: Irregular menstruation, unspecified: N92.6

## 2013-12-04 HISTORY — DX: Migraine, unspecified, not intractable, without status migrainosus: G43.909

## 2013-12-04 LAB — POCT HEMOGLOBIN-HEMACUE: Hemoglobin: 12.1 g/dL (ref 12.0–15.0)

## 2013-12-04 SURGERY — OPEN REDUCTION INTERNAL FIXATION (ORIF) CLAVICULAR FRACTURE
Anesthesia: General | Site: Shoulder | Laterality: Right

## 2013-12-04 MED ORDER — OXYCODONE HCL 5 MG PO TABS: 5.0000 mg | ORAL_TABLET | Freq: Once | ORAL | Status: DC | PRN

## 2013-12-04 MED ORDER — LIDOCAINE HCL (CARDIAC) 20 MG/ML IV SOLN
INTRAVENOUS | Status: DC | PRN
  Administered 2013-12-04: 80 mg via INTRAVENOUS

## 2013-12-04 MED ORDER — OXYCODONE HCL 5 MG/5ML PO SOLN: 5.0000 mg | Freq: Once | ORAL | Status: DC | PRN

## 2013-12-04 MED ORDER — CEFAZOLIN SODIUM-DEXTROSE 2-3 GM-% IV SOLR
2.0000 g | INTRAVENOUS | Status: AC
  Administered 2013-12-04: 2 g via INTRAVENOUS

## 2013-12-04 MED ORDER — SUCCINYLCHOLINE CHLORIDE 20 MG/ML IJ SOLN
INTRAMUSCULAR | Status: DC | PRN
  Administered 2013-12-04: 100 mg via INTRAVENOUS

## 2013-12-04 MED ORDER — MIDAZOLAM HCL 2 MG/ML PO SYRP: 12.0000 mg | ORAL_SOLUTION | Freq: Once | ORAL | Status: DC | PRN

## 2013-12-04 MED ORDER — CEFAZOLIN SODIUM-DEXTROSE 2-3 GM-% IV SOLR
INTRAVENOUS | Status: AC
  Filled 2013-12-04: qty 50

## 2013-12-04 MED ORDER — ONDANSETRON HCL 4 MG/2ML IJ SOLN
INTRAMUSCULAR | Status: DC | PRN
  Administered 2013-12-04: 4 mg via INTRAVENOUS

## 2013-12-04 MED ORDER — FENTANYL CITRATE 0.05 MG/ML IJ SOLN: 50.0000 ug | INTRAMUSCULAR | Status: DC | PRN

## 2013-12-04 MED ORDER — FENTANYL CITRATE 0.05 MG/ML IJ SOLN: 50.0000 ug | Freq: Once | INTRAMUSCULAR | Status: DC

## 2013-12-04 MED ORDER — DEXTROSE-NACL 5-0.45 % IV SOLN: 100.0000 mL/h | INTRAVENOUS | Status: DC

## 2013-12-04 MED ORDER — MIDAZOLAM HCL 5 MG/5ML IJ SOLN
INTRAMUSCULAR | Status: DC | PRN
  Administered 2013-12-04: 2 mg via INTRAVENOUS

## 2013-12-04 MED ORDER — BUPIVACAINE-EPINEPHRINE 0.5% -1:200000 IJ SOLN
INTRAMUSCULAR | Status: DC | PRN
  Administered 2013-12-04: 20 mL

## 2013-12-04 MED ORDER — FENTANYL CITRATE 0.05 MG/ML IJ SOLN
INTRAMUSCULAR | Status: AC
  Filled 2013-12-04: qty 2

## 2013-12-04 MED ORDER — MIDAZOLAM HCL 2 MG/2ML IJ SOLN: 1.0000 mg | INTRAMUSCULAR | Status: DC | PRN

## 2013-12-04 MED ORDER — ACETAMINOPHEN 500 MG PO TABS: 1000.0000 mg | ORAL_TABLET | Freq: Once | ORAL | Status: DC

## 2013-12-04 MED ORDER — PROPOFOL 10 MG/ML IV BOLUS
INTRAVENOUS | Status: DC | PRN
  Administered 2013-12-04: 250 mg via INTRAVENOUS
  Administered 2013-12-04: 30 mg via INTRAVENOUS

## 2013-12-04 MED ORDER — OXYCODONE HCL 5 MG PO TABS: 10.0000 mg | ORAL_TABLET | ORAL | Status: DC | PRN

## 2013-12-04 MED ORDER — HYDROMORPHONE HCL PF 1 MG/ML IJ SOLN
0.2500 mg | INTRAMUSCULAR | Status: DC | PRN
  Administered 2013-12-04: 0.25 mg via INTRAVENOUS
  Administered 2013-12-04: 0.5 mg via INTRAVENOUS

## 2013-12-04 MED ORDER — PROMETHAZINE HCL 25 MG/ML IJ SOLN: 6.2500 mg | INTRAMUSCULAR | Status: DC | PRN

## 2013-12-04 MED ORDER — DEXAMETHASONE SODIUM PHOSPHATE 4 MG/ML IJ SOLN
INTRAMUSCULAR | Status: DC | PRN
  Administered 2013-12-04: 10 mg via INTRAVENOUS

## 2013-12-04 MED ORDER — MIDAZOLAM HCL 2 MG/2ML IJ SOLN
INTRAMUSCULAR | Status: AC
  Filled 2013-12-04: qty 2

## 2013-12-04 MED ORDER — LACTATED RINGERS IV SOLN
INTRAVENOUS | Status: DC
  Administered 2013-12-04: 14:00:00 via INTRAVENOUS

## 2013-12-04 MED ORDER — FENTANYL CITRATE 0.05 MG/ML IJ SOLN
INTRAMUSCULAR | Status: AC
  Filled 2013-12-04: qty 6

## 2013-12-04 MED ORDER — FENTANYL CITRATE 0.05 MG/ML IJ SOLN
INTRAMUSCULAR | Status: DC | PRN
  Administered 2013-12-04: 100 ug via INTRAVENOUS
  Administered 2013-12-04 (×2): 50 ug via INTRAVENOUS

## 2013-12-04 MED ORDER — DOCUSATE SODIUM 100 MG PO CAPS: 100.0000 mg | ORAL_CAPSULE | Freq: Two times a day (BID) | ORAL | Status: DC

## 2013-12-04 MED ORDER — HYDROMORPHONE HCL PF 1 MG/ML IJ SOLN
INTRAMUSCULAR | Status: AC
  Filled 2013-12-04: qty 1

## 2013-12-04 SURGICAL SUPPLY — 58 items
BENZOIN TINCTURE PRP APPL 2/3 (GAUZE/BANDAGES/DRESSINGS) IMPLANT
BIT DRILL 2.6 (BIT) ×3 IMPLANT
BLADE SURG 15 STRL LF DISP TIS (BLADE) ×1 IMPLANT
BLADE SURG 15 STRL SS (BLADE) ×2
BLADE SURG ROTATE 9660 (MISCELLANEOUS) IMPLANT
CHLORAPREP W/TINT 26ML (MISCELLANEOUS) ×3 IMPLANT
CLOSURE WOUND 1/2 X4 (GAUZE/BANDAGES/DRESSINGS) ×1
DECANTER SPIKE VIAL GLASS SM (MISCELLANEOUS) IMPLANT
DRAPE U-SHAPE 47X51 STRL (DRAPES) ×3 IMPLANT
DRAPE U-SHAPE 76X120 STRL (DRAPES) ×6 IMPLANT
ELECT REM PT RETURN 9FT ADLT (ELECTROSURGICAL) ×3
ELECTRODE REM PT RTRN 9FT ADLT (ELECTROSURGICAL) ×1 IMPLANT
GAUZE XEROFORM 1X8 LF (GAUZE/BANDAGES/DRESSINGS) ×3 IMPLANT
GLOVE BIO SURGEON STRL SZ7 (GLOVE) ×3 IMPLANT
GLOVE BIO SURGEON STRL SZ7.5 (GLOVE) ×3 IMPLANT
GLOVE BIOGEL PI IND STRL 7.5 (GLOVE) ×1 IMPLANT
GLOVE BIOGEL PI IND STRL 8 (GLOVE) ×1 IMPLANT
GLOVE BIOGEL PI INDICATOR 7.5 (GLOVE) ×2
GLOVE BIOGEL PI INDICATOR 8 (GLOVE) ×2
GOWN STRL REUS W/ TWL LRG LVL3 (GOWN DISPOSABLE) ×2 IMPLANT
GOWN STRL REUS W/ TWL XL LVL3 (GOWN DISPOSABLE) ×1 IMPLANT
GOWN STRL REUS W/TWL LRG LVL3 (GOWN DISPOSABLE) ×4
GOWN STRL REUS W/TWL XL LVL3 (GOWN DISPOSABLE) ×2
NS IRRIG 1000ML POUR BTL (IV SOLUTION) ×3 IMPLANT
PACK ARTHROSCOPY DSU (CUSTOM PROCEDURE TRAY) ×3 IMPLANT
PACK BASIN DAY SURGERY FS (CUSTOM PROCEDURE TRAY) ×3 IMPLANT
PENCIL BUTTON HOLSTER BLD 10FT (ELECTRODE) ×3 IMPLANT
PLATE RIGHT MIDSHAFT SUPER 6H (Plate) ×3 IMPLANT
SCREW BONE 3.5X10 (Screw) ×9 IMPLANT
SCREW BONE 3.5X12 (Screw) ×6 IMPLANT
SCREW BONE 3.5X14MM (Screw) ×3 IMPLANT
SLEEVE SCD COMPRESS KNEE MED (MISCELLANEOUS) ×3 IMPLANT
SLING ARM IMMOBILIZER LRG (SOFTGOODS) IMPLANT
SLING ARM IMMOBILIZER MED (SOFTGOODS) IMPLANT
SLING ARM LRG ADULT FOAM STRAP (SOFTGOODS) IMPLANT
SLING ARM MED ADULT FOAM STRAP (SOFTGOODS) ×3 IMPLANT
SLING ARM XL FOAM STRAP (SOFTGOODS) IMPLANT
SPONGE GAUZE 4X4 12PLY (GAUZE/BANDAGES/DRESSINGS) ×3 IMPLANT
SPONGE LAP 18X18 X RAY DECT (DISPOSABLE) ×3 IMPLANT
STAPLER VISISTAT 35W (STAPLE) IMPLANT
STRIP CLOSURE SKIN 1/2X4 (GAUZE/BANDAGES/DRESSINGS) ×2 IMPLANT
SUCTION FRAZIER TIP 10 FR DISP (SUCTIONS) IMPLANT
SUT ETHILON 3 0 PS 1 (SUTURE) IMPLANT
SUT FIBERWIRE #2 38 T-5 BLUE (SUTURE)
SUT MNCRL AB 4-0 PS2 18 (SUTURE) ×3 IMPLANT
SUT MON AB 2-0 CT1 36 (SUTURE) ×3 IMPLANT
SUT RETRIEVER MED (INSTRUMENTS) IMPLANT
SUT VIC AB 0 CT1 27 (SUTURE)
SUT VIC AB 0 CT1 27XBRD ANBCTR (SUTURE) IMPLANT
SUT VIC AB 2-0 SH 27 (SUTURE)
SUT VIC AB 2-0 SH 27XBRD (SUTURE) IMPLANT
SUT VIC AB 3-0 SH 27 (SUTURE)
SUT VIC AB 3-0 SH 27X BRD (SUTURE) IMPLANT
SUTURE FIBERWR #2 38 T-5 BLUE (SUTURE) IMPLANT
SYR BULB 3OZ (MISCELLANEOUS) ×3 IMPLANT
TOWEL OR 17X24 6PK STRL BLUE (TOWEL DISPOSABLE) ×3 IMPLANT
TOWEL OR NON WOVEN STRL DISP B (DISPOSABLE) ×3 IMPLANT
YANKAUER SUCT BULB TIP NO VENT (SUCTIONS) ×3 IMPLANT

## 2013-12-04 NOTE — Anesthesia Procedure Notes (Signed)
Procedure Name: Intubation Performed by: York GricePEARSON, Andyn Sales W Pre-anesthesia Checklist: Patient identified, Timeout performed, Emergency Drugs available, Suction available and Patient being monitored Patient Re-evaluated:Patient Re-evaluated prior to inductionOxygen Delivery Method: Circle system utilized Preoxygenation: Pre-oxygenation with 100% oxygen Intubation Type: IV induction Ventilation: Mask ventilation without difficulty Laryngoscope Size: Miller and 2 Tube type: Oral Tube size: 7.0 mm Number of attempts: 1 Airway Equipment and Method: Stylet Placement Confirmation: ETT inserted through vocal cords under direct vision,  breath sounds checked- equal and bilateral and positive ETCO2 Secured at: 21 cm Tube secured with: Tape Dental Injury: Teeth and Oropharynx as per pre-operative assessment

## 2013-12-04 NOTE — Anesthesia Preprocedure Evaluation (Addendum)
Anesthesia Evaluation  Patient identified by MRN, date of birth, ID band Patient awake    Reviewed: Allergy & Precautions, H&P , NPO status , Patient's Chart, lab work & pertinent test results  History of Anesthesia Complications (+) history of anesthetic complications  Airway Mallampati: II TM Distance: >3 FB Neck ROM: Full    Dental no notable dental hx. (+) Teeth Intact   Pulmonary COPDCurrent Smoker,  + rhonchi   Pulmonary exam normal       Cardiovascular negative cardio ROS  Rhythm:Regular Rate:Normal     Neuro/Psych  Headaches, Seizures -, Well Controlled,  negative psych ROS   GI/Hepatic negative GI ROS, Neg liver ROS,   Endo/Other  negative endocrine ROS  Renal/GU negative Renal ROS  negative genitourinary   Musculoskeletal negative musculoskeletal ROS (+)   Abdominal   Peds  Hematology  (+) anemia ,   Anesthesia Other Findings   Reproductive/Obstetrics Desires permanent contraception                          Anesthesia Physical Anesthesia Plan  ASA: II  Anesthesia Plan: General   Post-op Pain Management:    Induction: Intravenous  Airway Management Planned: Oral ETT  Additional Equipment:   Intra-op Plan:   Post-operative Plan: Extubation in OR  Informed Consent: I have reviewed the patients History and Physical, chart, labs and discussed the procedure including the risks, benefits and alternatives for the proposed anesthesia with the patient or authorized representative who has indicated his/her understanding and acceptance.     Plan Discussed with: CRNA and Surgeon  Anesthesia Plan Comments:         Anesthesia Quick Evaluation

## 2013-12-04 NOTE — H&P (Signed)
  [facility] [address]   [address] [place], [place] [phone] A Division of [facility]  Loreta Aveaniel F. Loralye Loberg, M.D.   Robert A. Thurston HoleWainer, M.D.   Burnell BlanksW. Dan Caffrey, M.D.   Eulas PostJoshua P. Landau, M.D.   Lunette StandsAnna Voytek, M.D Jewel Baizeimothy D. Eulah PontMurphy, M.D.  Buford DresserWesley R. Ibazebo, M.D.  Estell HarpinJames S. Kramer, M.D.    Melina Fiddlerebecca S. Bassett, M.D. Mary L. Isidoro DonningAnton, PA-C  Kirstin A. Shepperson, PA-C  Josh Crawfordhadwell, PA-C JaytonBrandon Parry, [place]A-C   RE: Ramonita LabJones, Akemi   [phone]      DOB: 05/31/1976 PROGRESS NOTE: 12-01-13 Reason for visit:  Follow-up from the ER right clavicle fracture. Date of injury: 11/29/13. History of present illness: She suffered a slip on her porch and fell onto her right side suffered the above mentioned fracture. She has had significant difficulty with pain control. She is in a sling.   Please see associated documentation for this clinic visit for further past medical, family, surgical and social history, review of systems, and exam findings as this was reviewed by me.  EXAMINATION: Well appearing female in some distress secondary to pain, she is tearful. The right upper extremity is neurovascularly intact she has tenderness and obvious deformity with no skin tenting of the clavicle.   X-RAYS: X-rays reviewed by me: 2 views of the shoulder from the ER demonstrate over 2 cm of shortening and 100%  displacement of her midshaft clavicle fracture.    ASSESSMENT: Midshaft clavicle fracture with significant displacement.  PLAN: 1. I had a lengthy discussion with her in regards to her options, non-operative versus operative. 2. Given the shortening I feel she will have significant shoulder girdle weakness and cosmetic deformity. 3. She would like to go forward with open reduction internal fixation of this fracture.  4. We had a discussion about pain control and that this will be difficult given her current state. I am going to add Valium with  the Percocet that she has and see how she does with this.  5. Likely postoperatively we will need Oxycodone and Valium and will wean when appropriate. 6. She will stay in her sling until a couple weeks after surgery.  Jewel Baizeimothy D.  Eulah PontMurphy, M.D.  Electronically verified by Jewel Baizeimothy D. Eulah PontMurphy, M.D. TDM:kah D 12-01-13 T 12-01-13

## 2013-12-04 NOTE — Discharge Instructions (Signed)
°  Post Anesthesia Home Care Instructions  Activity: Get plenty of rest for the remainder of the day. A responsible adult should stay with you for 24 hours following the procedure.  For the next 24 hours, DO NOT: -Drive a car -Advertising copywriterperate machinery -Drink alcoholic beverages -Take any medication unless instructed by your physician -Make any legal decisions or sign important papers.  Meals: Start with liquid foods such as gelatin or soup. Progress to regular foods as tolerated. Avoid greasy, spicy, heavy foods. If nausea and/or vomiting occur, drink only clear liquids until the nausea and/or vomiting subsides. Call your physician if vomiting continues.  Special Instructions/Symptoms: Your throat may feel dry or sore from the anesthesia or the breathing tube placed in your throat during surgery. If this causes discomfort, gargle with warm salt water. The discomfort should disappear within 24 hours.    Dr. Renaye Rakersim Murphy Instructions:  Wear your sling full time   Keep your dressing clean and dry and on until follow up

## 2013-12-04 NOTE — Transfer of Care (Signed)
Immediate Anesthesia Transfer of Care Note  Patient: Alcide GoodnessMarcia T Bodine  Procedure(s) Performed: Procedure(s): OPEN REDUCTION INTERNAL FIXATION (ORIF) RIGHT CLAVICLE FRACTURE (Right)  Patient Location: PACU  Anesthesia Type:General  Level of Consciousness: awake and sedated  Airway & Oxygen Therapy: Patient Spontanous Breathing and Patient connected to face mask oxygen  Post-op Assessment: Report given to PACU RN and Post -op Vital signs reviewed and stable  Post vital signs: Reviewed and stable  Complications: No apparent anesthesia complications

## 2013-12-04 NOTE — Op Note (Signed)
12/04/2013  4:53 PM  PATIENT:  Katie Malone    PRE-OPERATIVE DIAGNOSIS:  RIGHT CLAVICLE FRACTURE --CLOSED   POST-OPERATIVE DIAGNOSIS:  Same  PROCEDURE:  OPEN REDUCTION INTERNAL FIXATION (ORIF) RIGHT CLAVICLE FRACTURE  SURGEON:  Sheral Apleyimothy D Gaylon Melchor, MD  PHYSICIAN ASSISTANT: Janace LittenBrandon Parry OPA  ANESTHESIA:   General  PREOPERATIVE INDICATIONS:  Katie Malone is a  38 y.o. female with a diagnosis of RIGHT CLAVICLE FRACTURE --CLOSED  who elected for surgical management based on preoperative shortening and angulation and displacement of the fracture.    The risks benefits and alternatives were discussed with the patient preoperatively including but not limited to the risks of infection, bleeding, nerve injury, malunion, nonunion, hardware failure, the need for hardware removal, recurrent fracture, cardiopulmonary complications, the need for revision surgery, among others, and the patient was willing to proceed.    OPERATIVE IMPLANTS: stryker 6 hole clavicle plate  OPERATIVE FINDINGS: Shortened, displaced clavicle fracture  OPERATIVE PROCEDURE: The patient was brought to the operating room and placed in the supine position. General anesthesia was administered. IV antibiotics were given. She was placed in the beach chair position. The upper extremity was prepped and draped in the usual sterile fashion. Time out was performed. Incision was made over the clavicle fracture. Dissection was carried down through the platysma, and the fracture site exposed. The fracture was extremely short.  I ultimately did however achieve satisfactory mobilization, and was able to reduce the fracture anatomically.   Placed a 6-hole midshaft Stryker superior plate over the superior surface of the clavicle. Taking care not penetrate I placed a proximal screw and then a distal screw in compression fashion as the fracture was somewhat transverse. I filled the other 4 holes and was happy with the bite on all screws.  I had  excellent bony apposition and restoration of anatomic alignment of the clavicle. Used C-arm to confirm appropriate alignment, reduction of the fracture, and positioning of the plate and length of the screws.  I then took final C-arm pictures, irrigated the wounds copiously, and repaired the fascia with inverted figure-of-eight Vicryl suture. The subcutaneous tissue was closed with Vicryl as well, and the skin closed with steri-strips, and the patient was awakened and returned to the PACU in stable and satisfactory condition. There were no complications.   POSTOPERATIVE PLAN: Sling full time, DVT px: ambulation

## 2013-12-04 NOTE — Anesthesia Postprocedure Evaluation (Signed)
  Anesthesia Post-op Note  Patient: Katie GoodnessMarcia T Malone  Procedure(s) Performed: Procedure(s): OPEN REDUCTION INTERNAL FIXATION (ORIF) RIGHT CLAVICLE FRACTURE (Right)  Patient Location: PACU  Anesthesia Type:General  Level of Consciousness: awake  Airway and Oxygen Therapy: Patient Spontanous Breathing  Post-op Pain: mild  Post-op Assessment: Post-op Vital signs reviewed, Patient's Cardiovascular Status Stable, Respiratory Function Stable, Patent Airway, No signs of Nausea or vomiting and Pain level controlled  Post-op Vital Signs: Reviewed and stable  Last Vitals:  Filed Vitals:   12/04/13 1730  BP: 112/62  Pulse: 77  Temp:   Resp: 20    Complications: No apparent anesthesia complications

## 2013-12-04 NOTE — Interval H&P Note (Signed)
History and Physical Interval Note:  12/04/2013 8:30 AM  Alcide GoodnessMarcia T Malone  has presented today for surgery, with the diagnosis of RIGHT CLAVICLE FRACTURE --CLOSED   The various methods of treatment have been discussed with the patient and family. After consideration of risks, benefits and other options for treatment, the patient has consented to  Procedure(s): OPEN REDUCTION INTERNAL FIXATION (ORIF) RIGHT CLAVICLE FRACTURE (Right) as a surgical intervention .  The patient's history has been reviewed, patient examined, no change in status, stable for surgery.  I have reviewed the patient's chart and labs.  Questions were answered to the patient's satisfaction.     Sheral Apleyimothy D Yuritzi Kamp

## 2013-12-06 ENCOUNTER — Encounter (HOSPITAL_BASED_OUTPATIENT_CLINIC_OR_DEPARTMENT_OTHER): Payer: Self-pay | Admitting: Orthopedic Surgery

## 2014-02-22 ENCOUNTER — Emergency Department (INDEPENDENT_AMBULATORY_CARE_PROVIDER_SITE_OTHER)
Admission: EM | Admit: 2014-02-22 | Discharge: 2014-02-22 | Disposition: A | Discharge status: Home / Self Care | Payer: Medicaid Other | Source: Home / Self Care | Attending: Emergency Medicine | Admitting: Emergency Medicine

## 2014-02-22 ENCOUNTER — Encounter (HOSPITAL_COMMUNITY): Payer: Self-pay | Admitting: Emergency Medicine

## 2014-02-22 DIAGNOSIS — M25519 Pain in unspecified shoulder: Secondary | ICD-10-CM

## 2014-02-22 DIAGNOSIS — T50905A Adverse effect of unspecified drugs, medicaments and biological substances, initial encounter: Secondary | ICD-10-CM

## 2014-02-22 MED ORDER — ONDANSETRON 4 MG PO TBDP: 4.0000 mg | ORAL_TABLET | Freq: Four times a day (QID) | ORAL | Status: DC | PRN

## 2014-02-22 NOTE — ED Provider Notes (Signed)
CSN: [id number]     Arrival date & time 02/22/14  1406 History   First MD Initiated Contact with Patient 02/22/14 1450     Chief Complaint  Patient presents with  . Shoulder Pain   (Consider location/radiation/quality/duration/timing/severity/associated sxs/prior Treatment) HPI Comments: 38 year old female presents complaining of severe right shoulder pain. She had a surgery for a fractured clavicle the back in April. She is treated by Dr. Eulah PontMurphy. He has been treating her with hydrocodone and oxycodone, she says that she cannot tolerate these medications and may make her vomit. She was recently switched to an anti-inflammatory, this causes her to vomit as well. She tried going by Dr. Greig RightMurphy's office earlier today but they could not see her so she came here. She is having severe 10 out of 10 pain and is not able to work. This is no different than the pain she has experienced since surgery. No numbness  Patient is a 38 y.o. female presenting with shoulder pain.  Shoulder Pain    Past Medical History  Diagnosis Date  . Complication of anesthesia 02/2013    seizure immediately after epidural started  . Pseudoseizures     none since 10/2011  . Migraines   . History of anemia     during pregnancy  . Seizures 02/2013    x 1 - onset after starting epidural anesthesia  . Right clavicle fracture 11/29/2013  . Irregular periods    Past Surgical History  Procedure Laterality Date  . Wisdom tooth extraction    . Laparoscopy  11/05/2011    Procedure: LAPAROSCOPY OPERATIVE;  Surgeon: Jerene BearsMary S Miller, MD;  Location: [facility];  Service: Gynecology;  Laterality: N/A;  Right Salpingectomy  . Laparoscopic bilateral salpingectomy Left 08/03/2013    Procedure: LEFT SALPINGECTOMY;  Surgeon: Willodean Rosenthalarolyn Harraway-Smith, MD;  Location: [facility];  Service: Gynecology;  Laterality: Left;  . Orif clavicular fracture Right 12/04/2013    Procedure: OPEN REDUCTION INTERNAL FIXATION (ORIF) RIGHT CLAVICLE FRACTURE;  Surgeon:  Sheral Apleyimothy D Murphy, MD;  Location: [facility];  Service: Orthopedics;  Laterality: Right;   Family History  Problem Relation Age of Onset  . Heart disease Mother   . Hypertension Mother    History  Substance Use Topics  . Smoking status: Current Every Day Smoker -- 0.50 packs/day for 20 years    Types: Cigarettes  . Smokeless tobacco: Never Used  . Alcohol Use: Yes     Comment: occasionally - none in 1 1/2 years   OB History   Grav Para Term Preterm Abortions TAB SAB Ect Mult Living   9 7 5 2 2 1  0 1  7     Review of Systems  Musculoskeletal: Positive for arthralgias (shoulder pain).  All other systems reviewed and are negative.   Allergies  Shrimp  Home Medications   Prior to Admission medications   Medication Sig Start Date End Date Taking? Authorizing Provider  diazepam (VALIUM) 2 MG tablet Take 2 mg by mouth every 6 (six) hours as needed for anxiety.    Historical Provider, MD  docusate sodium (COLACE) 100 MG capsule Take 1 capsule (100 mg total) by mouth 2 (two) times daily. Continue this while taking narcotics to help with bowel movements 12/04/13   Sheral Apleyimothy D Murphy, MD  ondansetron (ZOFRAN-ODT) 4 MG disintegrating tablet Take 1 tablet (4 mg total) by mouth every 6 (six) hours as needed (prior to taking pain medications to prevent vomiting). PRN for nausea or vomiting 02/22/14  Graylon Good, PA-C  oxyCODONE (ROXICODONE) 5 MG immediate release tablet Take 2 tablets (10 mg total) by mouth every 4 (four) hours as needed for severe pain. 12/04/13   Sheral Apley, MD  oxyCODONE-acetaminophen (PERCOCET/ROXICET) 5-325 MG per tablet Take 1 tablet by mouth every 4 (four) hours as needed. 11/29/13   Kristen N Ward, DO   BP 110/77  Pulse 84  Temp(Src) 98.4 F (36.9 C) (Oral)  Resp 16  SpO2 99% Physical Exam  Nursing note and vitals reviewed. Constitutional: She is oriented to person, place, and time. Vital signs are normal. She appears well-developed and  well-nourished. No distress.  HENT:  Head: Normocephalic and atraumatic.  Pulmonary/Chest: Effort normal. No respiratory distress.  Musculoskeletal:       Arms: Neurological: She is alert and oriented to person, place, and time. She has normal strength. Coordination normal.  Skin: Skin is warm and dry. No rash noted. She is not diaphoretic.  Psychiatric: She has a normal mood and affect. Judgment normal.    ED Course  Procedures (including critical care time) Labs Review Labs Reviewed - No data to display  Imaging Review No results found.   MDM   1. Adverse drug effect, initial encounter    Will prescribe Zofran for her to take prophylactically. She are has tried narcotics, anti-inflammatories, and Tylenol, there are no other types of pain medications that would be helpful for her so we will try to treat the side effects. Followup with Dr. Greig Right office   Meds ordered this encounter  Medications  . ondansetron (ZOFRAN-ODT) 4 MG disintegrating tablet    Sig: Take 1 tablet (4 mg total) by mouth every 6 (six) hours as needed (prior to taking pain medications to prevent vomiting). PRN for nausea or vomiting    Dispense:  30 tablet    Refill:  2    Order Specific Question:  Supervising Provider    Answer:  Lorenz Coaster, DAVID C [[id number]]       Graylon Good, PA-C 02/22/14 1525

## 2014-02-22 NOTE — Discharge Instructions (Signed)

## 2014-02-22 NOTE — ED Notes (Signed)
Pt  Has  Pain  From  Previous        Shoulder  Surgery         denys  Recent  Injury        Has  An  appt  In  sev  Weeks       With  Orthopedist

## 2014-02-24 NOTE — ED Provider Notes (Signed)
Medical screening examination/treatment/procedure(s) were performed by non-physician practitioner and as supervising physician I was immediately available for consultation/collaboration.  Edison Nicholson, M.D.  Kamille Toomey C Annaliza Zia, MD 02/24/14 0851 

## 2014-06-18 ENCOUNTER — Encounter (HOSPITAL_COMMUNITY): Payer: Self-pay | Admitting: Emergency Medicine

## 2016-03-31 ENCOUNTER — Ambulatory Visit (HOSPITAL_COMMUNITY)
Admission: EM | Admit: 2016-03-31 | Discharge: 2016-03-31 | Disposition: A | Discharge status: Home / Self Care | Payer: Medicaid Other | Attending: Family Medicine | Admitting: Family Medicine

## 2016-03-31 ENCOUNTER — Encounter (HOSPITAL_COMMUNITY): Payer: Self-pay | Admitting: Emergency Medicine

## 2016-03-31 DIAGNOSIS — G43009 Migraine without aura, not intractable, without status migrainosus: Secondary | ICD-10-CM

## 2016-03-31 MED ORDER — KETOROLAC TROMETHAMINE 60 MG/2ML IM SOLN
60.0000 mg | Freq: Once | INTRAMUSCULAR | Status: AC
  Administered 2016-03-31: 60 mg via INTRAMUSCULAR

## 2016-03-31 MED ORDER — DIPHENHYDRAMINE HCL 50 MG/ML IJ SOLN
INTRAMUSCULAR | Status: AC
  Filled 2016-03-31: qty 1

## 2016-03-31 MED ORDER — DIPHENHYDRAMINE HCL 50 MG/ML IJ SOLN
INTRAMUSCULAR | Status: AC
Start: 2016-03-31 — End: 2016-03-31
  Filled 2016-03-31: qty 1

## 2016-03-31 MED ORDER — DIPHENHYDRAMINE HCL 50 MG/ML IJ SOLN
50.0000 mg | Freq: Once | INTRAMUSCULAR | Status: AC
  Administered 2016-03-31: 50 mg via INTRAMUSCULAR

## 2016-03-31 MED ORDER — KETOROLAC TROMETHAMINE 60 MG/2ML IM SOLN
INTRAMUSCULAR | Status: AC
  Filled 2016-03-31: qty 2

## 2016-03-31 NOTE — Discharge Instructions (Signed)
You were given a shot of Toradol for pain and Benadryl to help you sleep. Recommend rest and increase water intake- limit Pepsi intake. Recommend go to ER if pain/symptoms do not improve or if they worsen.

## 2016-03-31 NOTE — ED Triage Notes (Signed)
[facility] a [facility] x 2 days. The patient reported a hx of migraines.

## 2016-03-31 NOTE — ED Provider Notes (Signed)
CSN: [id number]     Arrival date & time 03/31/16  1003 History   First MD Initiated Contact with Patient 03/31/16 1029     Chief Complaint  Patient presents with  . Headache   (Consider location/radiation/quality/duration/timing/severity/associated sxs/prior Treatment) 40 year old female presents with headache for 2 days. Started as frontal headache and now her entire head hurts radiating down her neck and behind her eyes. She has been unable to sleep for the past 2 days due to pain. She denies any nausea or vomiting. She is experiencing photophobia and some phonophobia. She has tried Tylenol and Ibuprofen with minimal relief. She has a history of migraine headaches but it has been over a year since her last bad headache.    The history is provided by the patient and a significant other.  Migraine  This is a chronic problem. The current episode started 2 days ago. The problem occurs constantly. The problem has been gradually worsening. Associated symptoms include headaches. Pertinent negatives include no chest pain and no shortness of breath. Nothing relieves the symptoms. She has tried ASA and rest for the symptoms. The treatment provided no relief.    Past Medical History:  Diagnosis Date  . Complication of anesthesia 02/2013   seizure immediately after epidural started  . History of anemia    during pregnancy  . Irregular periods   . Migraines   . Pseudoseizures    none since 10/2011  . Right clavicle fracture 11/29/2013  . Seizures ([facility]) 02/2013   x 1 - onset after starting epidural anesthesia   Past Surgical History:  Procedure Laterality Date  . LAPAROSCOPIC BILATERAL SALPINGECTOMY Left 08/03/2013   Procedure: LEFT SALPINGECTOMY;  Surgeon: Willodean Rosenthalarolyn Harraway-Smith, MD;  Location: [facility];  Service: Gynecology;  Laterality: Left;  . LAPAROSCOPY  11/05/2011   Procedure: LAPAROSCOPY OPERATIVE;  Surgeon: Jerene BearsMary S Miller, MD;  Location: [facility];  Service: Gynecology;  Laterality: N/A;  Right  Salpingectomy  . ORIF CLAVICULAR FRACTURE Right 12/04/2013   Procedure: OPEN REDUCTION INTERNAL FIXATION (ORIF) RIGHT CLAVICLE FRACTURE;  Surgeon: Sheral Apleyimothy D Murphy, MD;  Location: [facility];  Service: Orthopedics;  Laterality: Right;  . WISDOM TOOTH EXTRACTION     Family History  Problem Relation Age of Onset  . Heart disease Mother   . Hypertension Mother    Social History  Substance Use Topics  . Smoking status: Current Every Day Smoker    Packs/day: 0.50    Years: 20.00    Types: Cigarettes  . Smokeless tobacco: Never Used  . Alcohol use Yes     Comment: occasionally - none in 1 1/2 years   OB History    Gravida Para Term Preterm AB Living   9 7 5 2 2 7    SAB TAB Ectopic Multiple Live Births   0 1 1   4      Review of Systems  Constitutional: Positive for fatigue. Negative for fever.  HENT: Negative for congestion.   Respiratory: Negative for chest tightness and shortness of breath.   Cardiovascular: Negative for chest pain.  Musculoskeletal: Positive for myalgias.  Neurological: Positive for headaches. Negative for dizziness, seizures, syncope and numbness.    Allergies  Shrimp [shellfish allergy]  Home Medications   Prior to Admission medications   Medication Sig Start Date End Date Taking? Authorizing Provider  diazepam (VALIUM) 2 MG tablet Take 2 mg by mouth every 6 (six) hours as needed for anxiety.    Historical Provider, MD  Meds Ordered and Administered this Visit   Medications  ketorolac (TORADOL) injection 60 mg (60 mg Intramuscular Given 03/31/16 1059)  diphenhydrAMINE (BENADRYL) injection 50 mg (50 mg Intramuscular Given 03/31/16 1100)    BP 122/77 (BP Location: Right Arm)   Pulse 64   Temp 98.5 F (36.9 C) (Oral)   Resp 18   LMP 03/31/2016 (Exact Date)   SpO2 99%  No data found.   Physical Exam  Constitutional: She is oriented to person, place, and time. She appears well-developed and well-nourished. She appears ill.  HENT:   Head: Normocephalic and atraumatic.  Right Ear: Hearing, tympanic membrane, external ear and ear canal normal.  Left Ear: Hearing, tympanic membrane, external ear and ear canal normal.  Nose: Nose normal.  Mouth/Throat: Uvula is midline, oropharynx is clear and moist and mucous membranes are normal.  Eyes: Conjunctivae, EOM and lids are normal. Pupils are equal, round, and reactive to light.  Neck: Normal range of motion. Neck supple. Muscular tenderness present.  Cardiovascular: Normal rate, regular rhythm, normal heart sounds and intact distal pulses.   Pulmonary/Chest: Effort normal and breath sounds normal.  Musculoskeletal:  Has full range of motion of neck and head but with pain especially with rotation.   Neurological: She is alert and oriented to person, place, and time. She has normal strength and normal reflexes. No cranial nerve deficit or sensory deficit.  Skin: Skin is warm and dry. Capillary refill takes less than 2 seconds.  Psychiatric: She has a normal mood and affect. Her behavior is normal. Judgment and thought content normal.    Urgent Care Course   Clinical Course    Procedures (including critical care time)  Labs Review Labs Reviewed - No data to display  Imaging Review No results found.   Visual Acuity Review  Right Eye Distance:   Left Eye Distance:   Bilateral Distance:    Right Eye Near:   Left Eye Near:    Bilateral Near:         MDM   1. Migraine without aura and without status migrainosus, not intractable    Gave Toradol 60mg  IM today. Also gave Benadryl 50mg  IM to help with sleep. Recommend rest today. Follow-up with her primary care provider or go to ER if not improving in 2 to 3 days.     Sudie GrumblingAnn Berry Shamar Kracke, NP 04/01/16 [phone]

## 2016-04-01 ENCOUNTER — Emergency Department (HOSPITAL_COMMUNITY)
Admission: EM | Admit: 2016-04-01 | Discharge: 2016-04-01 | Disposition: A | Discharge status: Home / Self Care | Payer: Self-pay | Attending: Physician Assistant | Admitting: Physician Assistant

## 2016-04-01 ENCOUNTER — Encounter (HOSPITAL_COMMUNITY): Payer: Self-pay | Admitting: *Deleted

## 2016-04-01 DIAGNOSIS — G43909 Migraine, unspecified, not intractable, without status migrainosus: Secondary | ICD-10-CM | POA: Insufficient documentation

## 2016-04-01 DIAGNOSIS — F17210 Nicotine dependence, cigarettes, uncomplicated: Secondary | ICD-10-CM | POA: Insufficient documentation

## 2016-04-01 DIAGNOSIS — G43009 Migraine without aura, not intractable, without status migrainosus: Secondary | ICD-10-CM

## 2016-04-01 LAB — I-STAT CHEM 8, ED
BUN: 11 mg/dL (ref 6–20)
CALCIUM ION: 1.22 mmol/L (ref 1.13–1.30)
Chloride: 103 mmol/L (ref 101–111)
Creatinine, Ser: 0.8 mg/dL (ref 0.44–1.00)
Glucose, Bld: 80 mg/dL (ref 65–99)
HEMATOCRIT: 35 % — AB (ref 36.0–46.0)
HEMOGLOBIN: 11.9 g/dL — AB (ref 12.0–15.0)
Potassium: 4.1 mmol/L (ref 3.5–5.1)
SODIUM: 141 mmol/L (ref 135–145)
TCO2: 26 mmol/L (ref 0–100)

## 2016-04-01 MED ORDER — ONDANSETRON HCL 4 MG/2ML IJ SOLN
4.0000 mg | Freq: Once | INTRAMUSCULAR | Status: AC
  Administered 2016-04-01: 4 mg via INTRAVENOUS
  Filled 2016-04-01: qty 2

## 2016-04-01 MED ORDER — METOCLOPRAMIDE HCL 5 MG/ML IJ SOLN
10.0000 mg | Freq: Once | INTRAMUSCULAR | Status: AC
  Administered 2016-04-01: 10 mg via INTRAVENOUS
  Filled 2016-04-01: qty 2

## 2016-04-01 MED ORDER — DEXAMETHASONE SODIUM PHOSPHATE 10 MG/ML IJ SOLN
10.0000 mg | Freq: Once | INTRAMUSCULAR | Status: AC
  Administered 2016-04-01: 10 mg via INTRAVENOUS
  Filled 2016-04-01: qty 1

## 2016-04-01 MED ORDER — DIPHENHYDRAMINE HCL 50 MG/ML IJ SOLN
25.0000 mg | Freq: Once | INTRAMUSCULAR | Status: AC
  Administered 2016-04-01: 50 mg via INTRAVENOUS
  Filled 2016-04-01: qty 1

## 2016-04-01 MED ORDER — SODIUM CHLORIDE 0.9 % IV SOLN
INTRAVENOUS | Status: AC
  Administered 2016-04-01: 13:00:00 via INTRAVENOUS

## 2016-04-01 NOTE — ED Notes (Signed)
Brought patient back to room via wheelchair with visitor in tow; patient getting undressed and into a gown at this time

## 2016-04-01 NOTE — ED Triage Notes (Signed)
Pt reports having migraine headache for several days, hx of same. Went to [facility] yesterday and given meds which temp relieved her pain and now migraine has returned and more severe than before. Has sensitivity to light but denies n/v.

## 2016-04-01 NOTE — ED Notes (Signed)
Pt has now arrived to room via wheelchair with Katie Malone, EMT. Pt connected to pulse ox and BP cuff.

## 2016-04-01 NOTE — ED Provider Notes (Signed)
[facility]-EMERGENCY DEPT Provider Note   CSN: [id number] Arrival date & time: 04/01/16  1009     History   Chief Complaint Chief Complaint  Patient presents with  . Migraine    HPI Katie Malone is a 40 y.o. female who presents to the ED with headache that started 3 days ago. She describes the headache as sharp, throbbing that is all over her head. She rates the pain as 10/10. She went to Urgent care yesterday and had 2 injections and the headache stopped by the time she got home but  Returned last night.   The history is provided by the patient. No language interpreter was used.  Migraine  This is a new problem. The current episode started more than 2 days ago. The problem occurs constantly. The problem has been gradually worsening. Associated symptoms include headaches. Pertinent negatives include no chest pain, no abdominal pain and no shortness of breath. Nothing aggravates the symptoms. Nothing relieves the symptoms. She has tried a cold compress and acetaminophen for the symptoms.    Past Medical History:  Diagnosis Date  . Complication of anesthesia 02/2013   seizure immediately after epidural started  . History of anemia    during pregnancy  . Irregular periods   . Migraines   . Pseudoseizures    none since 10/2011  . Right clavicle fracture 11/29/2013  . Seizures (HCC) 02/2013   x 1 - onset after starting epidural anesthesia    Patient Active Problem List   Diagnosis Date Noted  . SVD (spontaneous vaginal delivery) 03/07/2013  . 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 ASCUS with positive high risk HPV 01/30/2013  . Anemia complicating pregnancy 01/04/2013  . Asymptomatic bacteriuria in pregnancy 11/28/2012  . Previous preterm delivery, antepartum 10/31/2012  . Late prenatal care 10/31/2012  . Advanced maternal age, antepartum 10/31/2012  . Prior pregnancy with fetal demise, antepartum 10/31/2012  . Smoking complicating pregnancy, childbirth, or the puerperium,  antepartum 10/31/2012  . Abnormal quad screen 10/31/2012  . Desires Sterilization 10/31/2012  . Rh negative state in antepartum period 10/31/2012  . H/O postpartum hemorrhage, currently pregnant 10/31/2012  . History of ectopic pregnancy 10/31/2012  . Hx of fetal anomaly in prior pregnancy, currently pregnant 10/31/2012  . 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 Headache in pregnancy, antepartum 10/31/2012    Past Surgical History:  Procedure Laterality Date  . LAPAROSCOPIC BILATERAL SALPINGECTOMY Left 08/03/2013   Procedure: LEFT SALPINGECTOMY;  Surgeon: Willodean Rosenthal, MD;  Location: [facility];  Service: Gynecology;  Laterality: Left;  . LAPAROSCOPY  11/05/2011   Procedure: LAPAROSCOPY OPERATIVE;  Surgeon: Jerene Bears, MD;  Location: [facility];  Service: Gynecology;  Laterality: N/A;  Right Salpingectomy  . ORIF CLAVICULAR FRACTURE Right 12/04/2013   Procedure: OPEN REDUCTION INTERNAL FIXATION (ORIF) RIGHT CLAVICLE FRACTURE;  Surgeon: Sheral Apley, MD;  Location: [facility];  Service: Orthopedics;  Laterality: Right;  . WISDOM TOOTH EXTRACTION      OB History    Gravida Para Term Preterm AB Living   9 7 5 2 2 7    SAB TAB Ectopic Multiple Live Births   0 1 1   4        Home Medications    Prior to Admission medications   Medication Sig Start Date End Date Taking? Authorizing Provider  acetaminophen (TYLENOL) 500 MG tablet Take 1,000 mg by mouth every 6 (six) hours as needed for moderate pain or headache.   Yes Historical  Provider, MD  ibuprofen (ADVIL,MOTRIN) 200 MG tablet Take 800 mg by mouth every 6 (six) hours as needed for headache or moderate pain.   Yes Historical Provider, MD    Family History Family History  Problem Relation Age of Onset  . Heart disease Mother   . Hypertension Mother     Social History Social History  Substance Use Topics  . Smoking status: Current Every Day Smoker    Packs/day: 0.50    Years:  20.00    Types: Cigarettes  . Smokeless tobacco: Never Used  . Alcohol use Yes     Comment: occasionally - none in 1 1/2 years     Allergies   Shrimp [shellfish allergy]   Review of Systems Review of Systems  Constitutional: Negative for chills and fever.  HENT: Negative for congestion, dental problem, ear pain, facial swelling, sinus pressure and sore throat.   Eyes: Positive for photophobia. Negative for visual disturbance.  Respiratory: Negative for cough, chest tightness and shortness of breath.   Cardiovascular: Negative for chest pain.  Gastrointestinal: Negative for abdominal pain, nausea and vomiting.  Genitourinary: Negative for decreased urine volume, dysuria and urgency.  Musculoskeletal: Negative for gait problem and neck stiffness.  Skin: Negative for rash.  Neurological: Positive for light-headedness and headaches. Negative for syncope.  Psychiatric/Behavioral: Negative for confusion. The patient is not nervous/anxious.      Physical Exam Updated Vital Signs BP 123/76   Pulse (!) 59   Temp 98.2 F (36.8 C) (Oral)   Resp 12   Ht 5\' 2"  (1.575 m)   Wt 68.9 kg   LMP 03/31/2016 (Exact Date)   SpO2 100%   BMI 27.80 kg/m   Physical Exam  Constitutional: She is oriented to person, place, and time. She appears well-developed and well-nourished. No distress.  HENT:  Head: Normocephalic and atraumatic.  Right Ear: Tympanic membrane normal.  Left Ear: Tympanic membrane normal.  Nose: Nose normal.  Mouth/Throat: Uvula is midline, oropharynx is clear and moist and mucous membranes are normal.  Eyes: Conjunctivae and EOM are normal.  Neck: Normal range of motion. Neck supple.  Cardiovascular: Normal rate and regular rhythm.   Pulmonary/Chest: Effort normal. She has no wheezes. She has no rales.  Abdominal: Soft. Bowel sounds are normal. She exhibits no mass. There is no tenderness.  Musculoskeletal: She exhibits no edema.  Radial and pedal pulses strong,  adequate circulation, good touch sensation.  Neurological: She is alert and oriented to person, place, and time. She has normal strength. No cranial nerve deficit or sensory deficit. She displays a negative Romberg sign. Gait normal.  Reflex Scores:      Bicep reflexes are 2+ on the right side and 2+ on the left side.      Brachioradialis reflexes are 2+ on the right side and 2+ on the left side.      Patellar reflexes are 2+ on the right side and 2+ on the left side.      Achilles reflexes are 2+ on the right side and 2+ on the left side. Rapid alternating movement without difficulty. Stands on one foot without difficulty.  Psychiatric: She has a normal mood and affect. Her behavior is normal.     ED Treatments / Results  Labs (all labs ordered are listed, but only abnormal results are displayed) Labs Reviewed  I-STAT CHEM 8, ED - Abnormal; Notable for the following:       Result Value   Hemoglobin 11.9 (*)  HCT 35.0 (*)    All other components within normal limits   Radiology No results found.  Procedures Procedures (including critical care time)  Medications Ordered in ED Medications  0.9 %  sodium chloride infusion ( Intravenous New Bag/Given 04/01/16 1240)  dexamethasone (DECADRON) injection 10 mg (10 mg Intravenous Given 04/01/16 1241)  diphenhydrAMINE (BENADRYL) injection 25 mg (50 mg Intravenous Given 04/01/16 1241)  metoCLOPramide (REGLAN) injection 10 mg (10 mg Intravenous Given 04/01/16 1241)  ondansetron (ZOFRAN) injection 4 mg (4 mg Intravenous Given 04/01/16 1309)     Initial Impression / Assessment and Plan / ED Course  I have reviewed the triage vital signs and the nursing notes.  Pertinent labs & imaging results that were available during my care of the patient were reviewed by me and considered in my medical decision making (see chart for details).  Clinical Course  IV normal saline, Benadryl 25 mg, decadron 10 mg and Reglan 10 mg.  1300 nausea continues,  Zofran ordered.   1355 after IV hydration and medications patient is feeling better, taking PO fluids without n/v and headache resolved.    Final Clinical Impressions(s) / ED Diagnoses  40 y.o. female with hx of migraines and similar headache today stable for d/c without neuro deficits. She will f/u with her PCP or return here as needed for worsening symptoms.   Final diagnoses:  Migraine without aura and without status migrainosus, not intractable    New Prescriptions New Prescriptions   No medications on file     [facility] M Neese, NP 04/01/16 1357    Courteney Randall AnLyn Mackuen, MD 04/01/16 1745

## 2016-06-29 ENCOUNTER — Emergency Department (HOSPITAL_COMMUNITY)
Admission: EM | Admit: 2016-06-29 | Discharge: 2016-06-29 | Disposition: A | Discharge status: Home / Self Care | Payer: Self-pay | Attending: Emergency Medicine | Admitting: Emergency Medicine

## 2016-06-29 ENCOUNTER — Encounter (HOSPITAL_COMMUNITY): Payer: Self-pay | Admitting: Emergency Medicine

## 2016-06-29 DIAGNOSIS — F17210 Nicotine dependence, cigarettes, uncomplicated: Secondary | ICD-10-CM | POA: Insufficient documentation

## 2016-06-29 DIAGNOSIS — F439 Reaction to severe stress, unspecified: Secondary | ICD-10-CM | POA: Insufficient documentation

## 2016-06-29 DIAGNOSIS — Z5181 Encounter for therapeutic drug level monitoring: Secondary | ICD-10-CM | POA: Insufficient documentation

## 2016-06-29 DIAGNOSIS — E876 Hypokalemia: Secondary | ICD-10-CM | POA: Insufficient documentation

## 2016-06-29 LAB — COMPREHENSIVE METABOLIC PANEL
ALBUMIN: 4.6 g/dL (ref 3.5–5.0)
ALT: 9 U/L — ABNORMAL LOW (ref 14–54)
ANION GAP: 8 (ref 5–15)
AST: 13 U/L — AB (ref 15–41)
Alkaline Phosphatase: 51 U/L (ref 38–126)
BILIRUBIN TOTAL: 0.6 mg/dL (ref 0.3–1.2)
BUN: 7 mg/dL (ref 6–20)
CHLORIDE: 104 mmol/L (ref 101–111)
CO2: 26 mmol/L (ref 22–32)
Calcium: 9.3 mg/dL (ref 8.9–10.3)
Creatinine, Ser: 0.76 mg/dL (ref 0.44–1.00)
GFR calc Af Amer: >60 mL/min (ref >60)
GLUCOSE: 101 mg/dL — AB (ref 65–99)
POTASSIUM: 3.2 mmol/L — AB (ref 3.5–5.1)
Sodium: 138 mmol/L (ref 135–145)
TOTAL PROTEIN: 7.5 g/dL (ref 6.5–8.1)

## 2016-06-29 LAB — RAPID URINE DRUG SCREEN, HOSP PERFORMED
AMPHETAMINES: NOT DETECTED
BENZODIAZEPINES: NOT DETECTED
Barbiturates: NOT DETECTED
COCAINE: NOT DETECTED
Opiates: NOT DETECTED
Tetrahydrocannabinol: POSITIVE — AB

## 2016-06-29 LAB — CBC
HCT: 37.3 % (ref 36.0–46.0)
Hemoglobin: 13.1 g/dL (ref 12.0–15.0)
MCH: 31.2 pg (ref 26.0–34.0)
MCHC: 35.1 g/dL (ref 30.0–36.0)
MCV: 88.8 fL (ref 78.0–100.0)
PLATELETS: 324 10*3/uL (ref 150–400)
RBC: 4.2 MIL/uL (ref 3.87–5.11)
RDW: 12.1 % (ref 11.5–15.5)
WBC: 9.6 10*3/uL (ref 4.0–10.5)

## 2016-06-29 LAB — ACETAMINOPHEN LEVEL

## 2016-06-29 LAB — PREGNANCY, URINE: Preg Test, Ur: NEGATIVE

## 2016-06-29 LAB — ETHANOL

## 2016-06-29 LAB — SALICYLATE LEVEL: Salicylate Lvl: <7 mg/dL (ref 2.8–30.0)

## 2016-06-29 MED ORDER — POTASSIUM CHLORIDE CRYS ER 20 MEQ PO TBCR: 40.0000 meq | EXTENDED_RELEASE_TABLET | Freq: Once | ORAL | Status: DC

## 2016-06-29 NOTE — ED Provider Notes (Signed)
[facility]-EMERGENCY DEPT Provider Note   CSN: [id number] Arrival date & time: 06/29/16  1606     History   Chief Complaint Chief Complaint  Patient presents with  . Suicidal  . IVC    HPI Katie Malone is a 40 y.o. female.  HPI  40 year old female presents under police custody after being involuntarily committed by her ex-boyfriend. Patient states this is all a misunderstanding and she is not truly suicidal. She states that 5 days ago she texted a picture to her ex-boyfriend of her holding a handful of pills that were ibuprofen. She states she never intended to take them but was seeking help from him for bills. She has been under a lot of stress recently. She never had any intent of suicide or harming herself. She has never had these thoughts per her. No depression. She is stressed based on finances and taking care of her children. She had a new a fight with her ex's mom 3 days ago. She called the patient this morning and the patient blocked her call. She then was served with IVC papers. DSS was also consulted. Patient is concerned that the ex boyfriend is trying to take custody of their children.  Past Medical History:  Diagnosis Date  . Complication of anesthesia 02/2013   seizure immediately after epidural started  . History of anemia    during pregnancy  . Irregular periods   . Migraines   . Pseudoseizures    none since 10/2011  . Right clavicle fracture 11/29/2013  . Seizures (HCC) 02/2013   x 1 - onset after starting epidural anesthesia    Patient Active Problem List   Diagnosis Date Noted  . SVD (spontaneous vaginal delivery) 03/07/2013  . 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 ASCUS with positive high risk HPV 01/30/2013  . Anemia complicating pregnancy 01/04/2013  . Asymptomatic bacteriuria in pregnancy 11/28/2012  . Previous preterm delivery, antepartum 10/31/2012  . Late prenatal care 10/31/2012  . Advanced maternal age, antepartum 10/31/2012  . Prior pregnancy with  fetal demise, antepartum 10/31/2012  . Smoking complicating pregnancy, childbirth, or the puerperium, antepartum 10/31/2012  . Abnormal quad screen 10/31/2012  . Desires Sterilization 10/31/2012  . Rh negative state in antepartum period 10/31/2012  . H/O postpartum hemorrhage, currently pregnant 10/31/2012  . History of ectopic pregnancy 10/31/2012  . Hx of fetal anomaly in prior pregnancy, currently pregnant 10/31/2012  . 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 Headache in pregnancy, antepartum 10/31/2012    Past Surgical History:  Procedure Laterality Date  . LAPAROSCOPIC BILATERAL SALPINGECTOMY Left 08/03/2013   Procedure: LEFT SALPINGECTOMY;  Surgeon: Willodean Rosenthalarolyn Harraway-Smith, MD;  Location: [facility];  Service: Gynecology;  Laterality: Left;  . LAPAROSCOPY  11/05/2011   Procedure: LAPAROSCOPY OPERATIVE;  Surgeon: Jerene BearsMary S Miller, MD;  Location: [facility];  Service: Gynecology;  Laterality: N/A;  Right Salpingectomy  . ORIF CLAVICULAR FRACTURE Right 12/04/2013   Procedure: OPEN REDUCTION INTERNAL FIXATION (ORIF) RIGHT CLAVICLE FRACTURE;  Surgeon: Sheral Apleyimothy D Murphy, MD;  Location: [facility];  Service: Orthopedics;  Laterality: Right;  . WISDOM TOOTH EXTRACTION      OB History    Gravida Para Term Preterm AB Living   9 7 5 2 2 7    SAB TAB Ectopic Multiple Live Births   0 1 1   4        Home Medications    Prior to Admission medications   Not on File    Family History  Family History  Problem Relation Age of Onset  . Heart disease Mother   . Hypertension Mother     Social History Social History  Substance Use Topics  . Smoking status: Current Every Day Smoker    Packs/day: 0.50    Years: 20.00    Types: Cigarettes  . Smokeless tobacco: Never Used  . Alcohol use Yes     Comment: occasionally - none in 1 1/2 years     Allergies   Shrimp [shellfish allergy]   Review of Systems Review of Systems  Constitutional: Negative for fever.    Respiratory: Negative for shortness of breath.   Cardiovascular: Negative for chest pain.  Gastrointestinal: Negative for abdominal pain.  Psychiatric/Behavioral: Negative for dysphoric mood, self-injury and suicidal ideas.  All other systems reviewed and are negative.    Physical Exam Updated Vital Signs BP 104/70 (BP Location: Left Arm)   Pulse (!) 50   Temp 98.1 F (36.7 C) (Oral)   Resp 18   SpO2 98%   Physical Exam  Constitutional: She is oriented to person, place, and time. She appears well-developed and well-nourished. No distress.  HENT:  Head: Normocephalic and atraumatic.  Right Ear: External ear normal.  Left Ear: External ear normal.  Nose: Nose normal.  Eyes: Right eye exhibits no discharge. Left eye exhibits no discharge.  Cardiovascular: Normal rate, regular rhythm and normal heart sounds.   Pulmonary/Chest: Effort normal and breath sounds normal.  Abdominal: Soft. There is no tenderness.  Neurological: She is alert and oriented to person, place, and time.  Skin: Skin is warm and dry. She is not diaphoretic.  Psychiatric: She does not exhibit a depressed mood. She expresses no homicidal and no suicidal ideation.  Nursing note and vitals reviewed.    ED Treatments / Results  Labs (all labs ordered are listed, but only abnormal results are displayed) Labs Reviewed  COMPREHENSIVE METABOLIC PANEL - Abnormal; Notable for the following:       Result Value   Potassium 3.2 (*)    Glucose, Bld 101 (*)    AST 13 (*)    ALT 9 (*)    All other components within normal limits  ACETAMINOPHEN LEVEL - Abnormal; Notable for the following:    Acetaminophen (Tylenol), Serum <10 (*)    All other components within normal limits  RAPID URINE DRUG SCREEN, HOSP PERFORMED - Abnormal; Notable for the following:    Tetrahydrocannabinol POSITIVE (*)    All other components within normal limits  ETHANOL  SALICYLATE LEVEL  CBC  PREGNANCY, URINE  POC URINE PREG, ED     EKG  EKG Interpretation None       Radiology No results found.  Procedures Procedures (including critical care time)  Medications Ordered in ED Medications  potassium chloride SA (K-DUR,KLOR-CON) CR tablet 40 mEq (not administered)     Initial Impression / Assessment and Plan / ED Course  I have reviewed the triage vital signs and the nursing notes.  Pertinent labs & imaging results that were available during my care of the patient were reviewed by me and considered in my medical decision making (see chart for details).  Clinical Course     Patient is adamantly saying she is not depressed or suicidal. She endorses a lot of stress in her life. I believe the patient is not really suicidal and that she has not attempted suicide and would not attempt suicide. TTS, [place], has seen the patient. She has run it by psychiatry,  Nanine MeansJamison Lord, who agrees the patient is stable for discharge. Will reverse IVC. Her medical screening labs are unremarkable except for oddly low potassium, repleted in the ED. Given resources to help with outpatient management of her stress.  Final Clinical Impressions(s) / ED Diagnoses   Final diagnoses:  Stress  Hypokalemia    New Prescriptions New Prescriptions   No medications on file     Pricilla LovelessScott Gianlucca Szymborski, MD 06/29/16 1836

## 2016-06-29 NOTE — ED Notes (Signed)
This RN called lab to ensure they will run urine pregnancy
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
## 2016-06-29 NOTE — Progress Notes (Signed)
Accepted pt at shift change, pt already had been discharged awaiting ride.  Ride here, discharge completed. Katie Malone P Marty Sadlowski
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
## 2017-08-11 ENCOUNTER — Emergency Department (HOSPITAL_COMMUNITY): Payer: No Typology Code available for payment source

## 2017-08-11 ENCOUNTER — Other Ambulatory Visit: Payer: Self-pay

## 2017-08-11 ENCOUNTER — Emergency Department (HOSPITAL_COMMUNITY): Payer: No Typology Code available for payment source | Admitting: Certified Registered Nurse Anesthetist

## 2017-08-11 ENCOUNTER — Encounter (HOSPITAL_COMMUNITY)
Admission: EM | Disposition: A | Discharge status: Home / Self Care | Payer: Self-pay | Source: Home / Self Care | Attending: Emergency Medicine

## 2017-08-11 ENCOUNTER — Observation Stay (HOSPITAL_COMMUNITY)
Admission: EM | Admit: 2017-08-11 | Discharge: 2017-08-12 | Disposition: A | Discharge status: Home / Self Care | Payer: No Typology Code available for payment source | Attending: General Surgery | Admitting: General Surgery

## 2017-08-11 ENCOUNTER — Encounter (HOSPITAL_COMMUNITY): Payer: Self-pay | Admitting: Orthopedic Surgery

## 2017-08-11 DIAGNOSIS — S21111A Laceration without foreign body of right front wall of thorax without penetration into thoracic cavity, initial encounter: Secondary | ICD-10-CM | POA: Diagnosis present

## 2017-08-11 DIAGNOSIS — S01412A Laceration without foreign body of left cheek and temporomandibular area, initial encounter: Secondary | ICD-10-CM | POA: Insufficient documentation

## 2017-08-11 DIAGNOSIS — F17210 Nicotine dependence, cigarettes, uncomplicated: Secondary | ICD-10-CM | POA: Diagnosis not present

## 2017-08-11 DIAGNOSIS — Y9389 Activity, other specified: Secondary | ICD-10-CM | POA: Insufficient documentation

## 2017-08-11 DIAGNOSIS — Y92096 Garden or yard of other non-institutional residence as the place of occurrence of the external cause: Secondary | ICD-10-CM | POA: Diagnosis not present

## 2017-08-11 DIAGNOSIS — T797XXA Traumatic subcutaneous emphysema, initial encounter: Secondary | ICD-10-CM | POA: Insufficient documentation

## 2017-08-11 DIAGNOSIS — S01411A Laceration without foreign body of right cheek and temporomandibular area, initial encounter: Secondary | ICD-10-CM | POA: Insufficient documentation

## 2017-08-11 DIAGNOSIS — S61211A Laceration without foreign body of left index finger without damage to nail, initial encounter: Secondary | ICD-10-CM | POA: Insufficient documentation

## 2017-08-11 DIAGNOSIS — S41111A Laceration without foreign body of right upper arm, initial encounter: Secondary | ICD-10-CM | POA: Diagnosis not present

## 2017-08-11 DIAGNOSIS — Y998 Other external cause status: Secondary | ICD-10-CM | POA: Diagnosis not present

## 2017-08-11 HISTORY — PX: AXILLARY LYMPH NODE DISSECTION: SHX5229

## 2017-08-11 LAB — TYPE AND SCREEN
ABO/RH(D): A NEG
ANTIBODY SCREEN: NEGATIVE
UNIT DIVISION: 0
Unit division: 0
Unit division: 0

## 2017-08-11 LAB — PREPARE FRESH FROZEN PLASMA
UNIT DIVISION: 0
UNIT DIVISION: 0
Unit division: 0

## 2017-08-11 LAB — I-STAT CG4 LACTIC ACID, ED: LACTIC ACID, VENOUS: 9.42 mmol/L — AB (ref 0.5–1.9)

## 2017-08-11 LAB — COMPREHENSIVE METABOLIC PANEL
ALBUMIN: 4 g/dL (ref 3.5–5.0)
ALT: 13 U/L — AB (ref 14–54)
AST: 30 U/L (ref 15–41)
Alkaline Phosphatase: 54 U/L (ref 38–126)
Anion gap: 17 — ABNORMAL HIGH (ref 5–15)
BUN: 7 mg/dL (ref 6–20)
CHLORIDE: 104 mmol/L (ref 101–111)
CO2: 17 mmol/L — AB (ref 22–32)
CREATININE: 1.02 mg/dL — AB (ref 0.44–1.00)
Calcium: 9.3 mg/dL (ref 8.9–10.3)
GFR calc Af Amer: >60 mL/min (ref >60)
GLUCOSE: 163 mg/dL — AB (ref 65–99)
Potassium: 3.1 mmol/L — ABNORMAL LOW (ref 3.5–5.1)
SODIUM: 138 mmol/L (ref 135–145)
Total Bilirubin: 0.7 mg/dL (ref 0.3–1.2)
Total Protein: 7.1 g/dL (ref 6.5–8.1)

## 2017-08-11 LAB — CREATININE, SERUM: CREATININE: 0.79 mg/dL (ref 0.44–1.00)

## 2017-08-11 LAB — BPAM RBC
BLOOD PRODUCT EXPIRATION DATE: 201901052359
BLOOD PRODUCT EXPIRATION DATE: 201901102359
Blood Product Expiration Date: 201901092359
ISSUE DATE / TIME: 201812230844
ISSUE DATE / TIME: 201812260940
ISSUE DATE / TIME: 201812260940
UNIT TYPE AND RH: 9500
UNIT TYPE AND RH: 9500
Unit Type and Rh: 9500

## 2017-08-11 LAB — BPAM FFP
BLOOD PRODUCT EXPIRATION DATE: 201901012359
BLOOD PRODUCT EXPIRATION DATE: 201901012359
BLOOD PRODUCT EXPIRATION DATE: 201901012359
ISSUE DATE / TIME: 201812260941
ISSUE DATE / TIME: 201812260941
ISSUE DATE / TIME: 201812260943
UNIT TYPE AND RH: 6200
Unit Type and Rh: 6200
Unit Type and Rh: 6200

## 2017-08-11 LAB — CBC
HCT: 37.4 % (ref 36.0–46.0)
HCT: 34.7 % — ABNORMAL LOW (ref 36.0–46.0)
Hemoglobin: 12.7 g/dL (ref 12.0–15.0)
Hemoglobin: 11.9 g/dL — ABNORMAL LOW (ref 12.0–15.0)
MCH: 31 pg (ref 26.0–34.0)
MCH: 30.9 pg (ref 26.0–34.0)
MCHC: 34 g/dL (ref 30.0–36.0)
MCHC: 34.3 g/dL (ref 30.0–36.0)
MCV: 91.2 fL (ref 78.0–100.0)
MCV: 90.1 fL (ref 78.0–100.0)
PLATELETS: 283 10*3/uL (ref 150–400)
PLATELETS: 263 10*3/uL (ref 150–400)
RBC: 4.1 MIL/uL (ref 3.87–5.11)
RBC: 3.85 MIL/uL — ABNORMAL LOW (ref 3.87–5.11)
RDW: 12.2 % (ref 11.5–15.5)
RDW: 12.3 % (ref 11.5–15.5)
WBC: 8.2 10*3/uL (ref 4.0–10.5)
WBC: 17.3 10*3/uL — AB (ref 4.0–10.5)

## 2017-08-11 LAB — URINALYSIS, ROUTINE W REFLEX MICROSCOPIC
BACTERIA UA: NONE SEEN
Bilirubin Urine: NEGATIVE
Glucose, UA: NEGATIVE mg/dL
Ketones, ur: 5 mg/dL — AB
Leukocytes, UA: NEGATIVE
Nitrite: NEGATIVE
PH: 6 (ref 5.0–8.0)
Protein, ur: NEGATIVE mg/dL
SPECIFIC GRAVITY, URINE: 1.017 (ref 1.005–1.030)

## 2017-08-11 LAB — I-STAT BETA HCG BLOOD, ED (MC, WL, AP ONLY)

## 2017-08-11 LAB — ABO/RH: ABO/RH(D): A NEG

## 2017-08-11 LAB — I-STAT CHEM 8, ED
BUN: 7 mg/dL (ref 6–20)
CREATININE: 0.8 mg/dL (ref 0.44–1.00)
Calcium, Ion: 1.08 mmol/L — ABNORMAL LOW (ref 1.15–1.40)
Chloride: 105 mmol/L (ref 101–111)
Glucose, Bld: 159 mg/dL — ABNORMAL HIGH (ref 65–99)
HEMATOCRIT: 37 % (ref 36.0–46.0)
HEMOGLOBIN: 12.6 g/dL (ref 12.0–15.0)
POTASSIUM: 3.1 mmol/L — AB (ref 3.5–5.1)
Sodium: 140 mmol/L (ref 135–145)
TCO2: 16 mmol/L — ABNORMAL LOW (ref 22–32)

## 2017-08-11 LAB — CDS SEROLOGY

## 2017-08-11 LAB — ETHANOL: Alcohol, Ethyl (B): <10 mg/dL (ref <10)

## 2017-08-11 LAB — LACTIC ACID, PLASMA: Lactic Acid, Venous: 1.2 mmol/L (ref 0.5–1.9)

## 2017-08-11 SURGERY — LYMPHADENECTOMY, AXILLARY
Anesthesia: General | Site: Breast | Laterality: Right

## 2017-08-11 MED ORDER — LIDOCAINE HCL (PF) 1 % IJ SOLN
INTRAMUSCULAR | Status: AC
  Filled 2017-08-11: qty 30

## 2017-08-11 MED ORDER — DOCUSATE SODIUM 100 MG PO CAPS
100.0000 mg | ORAL_CAPSULE | Freq: Two times a day (BID) | ORAL | Status: DC
  Administered 2017-08-11 – 2017-08-12 (×3): 100 mg via ORAL
  Filled 2017-08-11 (×3): qty 1

## 2017-08-11 MED ORDER — ONDANSETRON HCL 4 MG/2ML IJ SOLN
4.0000 mg | Freq: Four times a day (QID) | INTRAMUSCULAR | Status: DC | PRN
  Administered 2017-08-11: 4 mg via INTRAVENOUS
  Filled 2017-08-11: qty 2

## 2017-08-11 MED ORDER — PHENYLEPHRINE HCL 10 MG/ML IJ SOLN
INTRAMUSCULAR | Status: DC | PRN
  Administered 2017-08-11 (×5): 80 ug via INTRAVENOUS

## 2017-08-11 MED ORDER — SODIUM CHLORIDE 0.9 % IV SOLN
INTRAVENOUS | Status: AC | PRN
  Administered 2017-08-11: 500 mL via INTRAVENOUS

## 2017-08-11 MED ORDER — 0.9 % SODIUM CHLORIDE (POUR BTL) OPTIME
TOPICAL | Status: DC | PRN
  Administered 2017-08-11: 2000 mL

## 2017-08-11 MED ORDER — MIDAZOLAM HCL 5 MG/5ML IJ SOLN
INTRAMUSCULAR | Status: DC | PRN
  Administered 2017-08-11: 2 mg via INTRAVENOUS

## 2017-08-11 MED ORDER — FENTANYL CITRATE (PF) 100 MCG/2ML IJ SOLN
INTRAMUSCULAR | Status: AC | PRN
  Administered 2017-08-11: 50 ug via INTRAVENOUS

## 2017-08-11 MED ORDER — CEFAZOLIN SODIUM-DEXTROSE 2-4 GM/100ML-% IV SOLN
2.0000 g | Freq: Once | INTRAVENOUS | Status: AC
  Administered 2017-08-11: 2 g via INTRAVENOUS
  Filled 2017-08-11: qty 100

## 2017-08-11 MED ORDER — LACTATED RINGERS IV SOLN
INTRAVENOUS | Status: DC | PRN
  Administered 2017-08-11: 11:00:00 via INTRAVENOUS

## 2017-08-11 MED ORDER — ENOXAPARIN SODIUM 40 MG/0.4ML ~~LOC~~ SOLN
40.0000 mg | SUBCUTANEOUS | Status: DC
  Administered 2017-08-12: 40 mg via SUBCUTANEOUS

## 2017-08-11 MED ORDER — HYDROMORPHONE HCL 1 MG/ML IJ SOLN
0.5000 mg | INTRAMUSCULAR | Status: DC | PRN
  Administered 2017-08-11 – 2017-08-12 (×3): 0.5 mg via INTRAVENOUS
  Filled 2017-08-11 (×3): qty 1

## 2017-08-11 MED ORDER — CEFAZOLIN SODIUM-DEXTROSE 1-4 GM/50ML-% IV SOLN
1.0000 g | Freq: Three times a day (TID) | INTRAVENOUS | Status: AC
  Administered 2017-08-11 – 2017-08-12 (×2): 1 g via INTRAVENOUS
  Filled 2017-08-11 (×2): qty 50

## 2017-08-11 MED ORDER — POVIDONE-IODINE 10 % OINT PACKET
TOPICAL_OINTMENT | CUTANEOUS | Status: DC | PRN
  Administered 2017-08-11: 1 via TOPICAL

## 2017-08-11 MED ORDER — ONDANSETRON HCL 4 MG/2ML IJ SOLN
4.0000 mg | Freq: Once | INTRAMUSCULAR | Status: AC
  Administered 2017-08-11: 4 mg via INTRAVENOUS

## 2017-08-11 MED ORDER — SUGAMMADEX SODIUM 200 MG/2ML IV SOLN
INTRAVENOUS | Status: DC | PRN
  Administered 2017-08-11: 126.2 mg via INTRAVENOUS

## 2017-08-11 MED ORDER — OXYCODONE HCL 5 MG PO TABS
5.0000 mg | ORAL_TABLET | ORAL | Status: DC | PRN
  Administered 2017-08-11 – 2017-08-12 (×5): 5 mg via ORAL
  Filled 2017-08-11 (×5): qty 1

## 2017-08-11 MED ORDER — FENTANYL CITRATE (PF) 100 MCG/2ML IJ SOLN
50.0000 ug | Freq: Once | INTRAMUSCULAR | Status: AC
  Administered 2017-08-11: 50 ug via INTRAVENOUS

## 2017-08-11 MED ORDER — HYDROMORPHONE HCL 1 MG/ML IJ SOLN
0.2500 mg | INTRAMUSCULAR | Status: DC | PRN
  Administered 2017-08-11 (×2): 0.5 mg via INTRAVENOUS

## 2017-08-11 MED ORDER — PANTOPRAZOLE SODIUM 40 MG PO TBEC
40.0000 mg | DELAYED_RELEASE_TABLET | Freq: Every day | ORAL | Status: DC
  Administered 2017-08-11 – 2017-08-12 (×2): 40 mg via ORAL
  Filled 2017-08-11 (×2): qty 1

## 2017-08-11 MED ORDER — HYDROMORPHONE HCL 1 MG/ML IJ SOLN
INTRAMUSCULAR | Status: AC
  Administered 2017-08-11: 0.5 mg via INTRAVENOUS
  Filled 2017-08-11: qty 1

## 2017-08-11 MED ORDER — ACETAMINOPHEN 500 MG PO TABS
1000.0000 mg | ORAL_TABLET | Freq: Three times a day (TID) | ORAL | Status: DC
  Administered 2017-08-11 – 2017-08-12 (×4): 1000 mg via ORAL
  Filled 2017-08-11 (×4): qty 2

## 2017-08-11 MED ORDER — FENTANYL CITRATE (PF) 250 MCG/5ML IJ SOLN
INTRAMUSCULAR | Status: AC
  Filled 2017-08-11: qty 5

## 2017-08-11 MED ORDER — POVIDONE-IODINE 10 % EX OINT
TOPICAL_OINTMENT | CUTANEOUS | Status: AC
  Filled 2017-08-11: qty 28.35

## 2017-08-11 MED ORDER — LIDOCAINE HCL (CARDIAC) 20 MG/ML IV SOLN
INTRAVENOUS | Status: DC | PRN
  Administered 2017-08-11: 60 mg via INTRAVENOUS

## 2017-08-11 MED ORDER — ROCURONIUM BROMIDE 100 MG/10ML IV SOLN
INTRAVENOUS | Status: DC | PRN
  Administered 2017-08-11: 30 mg via INTRAVENOUS

## 2017-08-11 MED ORDER — MIDAZOLAM HCL 2 MG/2ML IJ SOLN
INTRAMUSCULAR | Status: AC
  Filled 2017-08-11: qty 2

## 2017-08-11 MED ORDER — PROMETHAZINE HCL 25 MG/ML IJ SOLN: 6.2500 mg | INTRAMUSCULAR | Status: DC | PRN

## 2017-08-11 MED ORDER — IOPAMIDOL (ISOVUE-370) INJECTION 76%
INTRAVENOUS | Status: AC
  Administered 2017-08-11: 100 mL
  Filled 2017-08-11: qty 100

## 2017-08-11 MED ORDER — ONDANSETRON 4 MG PO TBDP
4.0000 mg | ORAL_TABLET | Freq: Four times a day (QID) | ORAL | Status: DC | PRN
  Administered 2017-08-11: 4 mg via ORAL
  Filled 2017-08-11: qty 1

## 2017-08-11 MED ORDER — POTASSIUM CHLORIDE IN NACL 20-0.9 MEQ/L-% IV SOLN
INTRAVENOUS | Status: DC
  Administered 2017-08-11: 14:00:00 via INTRAVENOUS
  Filled 2017-08-11 (×2): qty 1000

## 2017-08-11 MED ORDER — ONDANSETRON HCL 4 MG/2ML IJ SOLN
INTRAMUSCULAR | Status: DC | PRN
  Administered 2017-08-11: 4 mg via INTRAVENOUS

## 2017-08-11 MED ORDER — PROPOFOL 10 MG/ML IV BOLUS
INTRAVENOUS | Status: DC | PRN
  Administered 2017-08-11: 50 mg via INTRAVENOUS
  Administered 2017-08-11: 150 mg via INTRAVENOUS

## 2017-08-11 MED ORDER — FENTANYL CITRATE (PF) 250 MCG/5ML IJ SOLN
INTRAMUSCULAR | Status: DC | PRN
  Administered 2017-08-11: 100 ug via INTRAVENOUS
  Administered 2017-08-11: 50 ug via INTRAVENOUS

## 2017-08-11 MED ORDER — DEXAMETHASONE SODIUM PHOSPHATE 4 MG/ML IJ SOLN
INTRAMUSCULAR | Status: DC | PRN
  Administered 2017-08-11: 10 mg via INTRAVENOUS

## 2017-08-11 MED ORDER — PANTOPRAZOLE SODIUM 40 MG IV SOLR: 40.0000 mg | Freq: Every day | INTRAVENOUS | Status: DC

## 2017-08-11 MED ORDER — HEMOSTATIC AGENTS (NO CHARGE) OPTIME
TOPICAL | Status: DC | PRN
  Administered 2017-08-11 (×3): 1 via TOPICAL

## 2017-08-11 MED ORDER — SUCCINYLCHOLINE CHLORIDE 20 MG/ML IJ SOLN
INTRAMUSCULAR | Status: DC | PRN
  Administered 2017-08-11: 80 mg via INTRAVENOUS

## 2017-08-11 MED ORDER — PROPOFOL 10 MG/ML IV BOLUS
INTRAVENOUS | Status: AC
  Filled 2017-08-11: qty 40

## 2017-08-11 SURGICAL SUPPLY — 52 items
BNDG STRETCH 4X75 NS LF (GAUZE/BANDAGES/DRESSINGS) ×3 IMPLANT
CANISTER SUCT 3000ML PPV (MISCELLANEOUS) ×3 IMPLANT
CHLORAPREP W/TINT 26ML (MISCELLANEOUS) ×3 IMPLANT
CLEANER TIP ELECTROSURG 2X2 (MISCELLANEOUS) ×3 IMPLANT
CLOSURE WOUND 1/2 X4 (GAUZE/BANDAGES/DRESSINGS)
COVER SURGICAL LIGHT HANDLE (MISCELLANEOUS) ×3 IMPLANT
DECANTER SPIKE VIAL GLASS SM (MISCELLANEOUS) IMPLANT
DERMABOND ADVANCED (GAUZE/BANDAGES/DRESSINGS) ×2
DERMABOND ADVANCED .7 DNX12 (GAUZE/BANDAGES/DRESSINGS) ×1 IMPLANT
DRAIN CHANNEL 19F RND (DRAIN) IMPLANT
DRAPE CHEST BREAST 15X10 FENES (DRAPES) ×3 IMPLANT
DRAPE UTILITY XL STRL (DRAPES) ×6 IMPLANT
ELECT REM PT RETURN 9FT ADLT (ELECTROSURGICAL) ×3
ELECTRODE REM PT RTRN 9FT ADLT (ELECTROSURGICAL) ×1 IMPLANT
EVACUATOR SILICONE 100CC (DRAIN) ×3 IMPLANT
GAUZE SPONGE 4X4 12PLY STRL (GAUZE/BANDAGES/DRESSINGS) IMPLANT
GAUZE SPONGE 4X4 12PLY STRL LF (GAUZE/BANDAGES/DRESSINGS) ×6 IMPLANT
GAUZE SPONGE 4X4 16PLY XRAY LF (GAUZE/BANDAGES/DRESSINGS) ×3 IMPLANT
GAUZE XEROFORM 1X8 LF (GAUZE/BANDAGES/DRESSINGS) IMPLANT
GLOVE BIOGEL PI IND STRL 8 (GLOVE) ×1 IMPLANT
GLOVE BIOGEL PI INDICATOR 8 (GLOVE) ×2
GLOVE ECLIPSE 7.5 STRL STRAW (GLOVE) ×3 IMPLANT
GOWN STRL REUS W/ TWL LRG LVL3 (GOWN DISPOSABLE) ×2 IMPLANT
GOWN STRL REUS W/TWL LRG LVL3 (GOWN DISPOSABLE) ×4
HEMOSTAT SNOW SURGICEL 2X4 (HEMOSTASIS) ×9 IMPLANT
KIT BASIN OR (CUSTOM PROCEDURE TRAY) ×3 IMPLANT
KIT ROOM TURNOVER OR (KITS) ×3 IMPLANT
NEEDLE HYPO 25GX1X1/2 BEV (NEEDLE) ×3 IMPLANT
NS IRRIG 1000ML POUR BTL (IV SOLUTION) ×3 IMPLANT
PACK SURGICAL SETUP 50X90 (CUSTOM PROCEDURE TRAY) ×3 IMPLANT
PAD ARMBOARD 7.5X6 YLW CONV (MISCELLANEOUS) ×6 IMPLANT
PENCIL BUTTON HOLSTER BLD 10FT (ELECTRODE) ×3 IMPLANT
SPECIMEN JAR MEDIUM (MISCELLANEOUS) IMPLANT
SPONGE LAP 18X18 X RAY DECT (DISPOSABLE) ×6 IMPLANT
STAPLER VISISTAT 35W (STAPLE) ×3 IMPLANT
STRIP CLOSURE SKIN 1/2X4 (GAUZE/BANDAGES/DRESSINGS) IMPLANT
SUT ETHILON 3 0 FSL (SUTURE) ×3 IMPLANT
SUT PROLENE 5 0 C 1 36 (SUTURE) ×3 IMPLANT
SUT VIC AB 2-0 SH 18 (SUTURE) ×3 IMPLANT
SUT VIC AB 3-0 SH 18 (SUTURE) ×3 IMPLANT
SUT VIC AB 4-0 SH 27 (SUTURE) ×2
SUT VIC AB 4-0 SH 27XBRD (SUTURE) ×1 IMPLANT
SUT VIC AB 5-0 P-3 18XBRD (SUTURE) ×1 IMPLANT
SUT VIC AB 5-0 P3 18 (SUTURE) ×2
SYR CONTROL 10ML LL (SYRINGE) ×3 IMPLANT
TAPE CLOTH SURG 4X10 WHT LF (GAUZE/BANDAGES/DRESSINGS) ×6 IMPLANT
TOWEL OR 17X24 6PK STRL BLUE (TOWEL DISPOSABLE) ×3 IMPLANT
TOWEL OR 17X26 10 PK STRL BLUE (TOWEL DISPOSABLE) ×3 IMPLANT
TUBE CONNECTING 12'X1/4 (SUCTIONS) ×1
TUBE CONNECTING 12X1/4 (SUCTIONS) ×2 IMPLANT
WATER STERILE IRR 1000ML POUR (IV SOLUTION) IMPLANT
YANKAUER SUCT BULB TIP NO VENT (SUCTIONS) ×3 IMPLANT
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
## 2017-08-11 NOTE — Transfer of Care (Signed)
Immediate Anesthesia Transfer of Care Note  Patient: Katie BorerMarcia XXXJones  Procedure(s) Performed: Irrigation and debridment right axillary/laceration, washout of left index finger laceration with closure (Right Breast)  Patient Location: PACU  Anesthesia Type:General  Level of Consciousness: awake, alert , oriented, patient cooperative and responds to stimulation  Airway & Oxygen Therapy: Patient Spontanous Breathing and Patient connected to nasal cannula oxygen  Post-op Assessment: Report given to RN, Post -op Vital signs reviewed and stable and Patient moving all extremities X 4  Post vital signs: Reviewed and stable  Last Vitals:  Vitals:   08/11/17 1020 08/11/17 1030  BP: 102/76 121/81  Pulse:    Resp: (!) 24   Temp:    SpO2: 100% 100%    Last Pain:  Vitals:   08/11/17 1028  TempSrc:   PainSc: 10-Worst pain ever         Complications: No apparent anesthesia complications

## 2017-08-11 NOTE — Anesthesia Preprocedure Evaluation (Signed)
Anesthesia Evaluation  Patient identified by MRN, date of birth, ID band Patient awake    Reviewed: Allergy & Precautions, NPO status , Patient's Chart, lab work & pertinent test results  Airway Mallampati: II  TM Distance: >3 FB Neck ROM: Full    Dental no notable dental hx.    Pulmonary neg pulmonary ROS,    Pulmonary exam normal breath sounds clear to auscultation       Cardiovascular negative cardio ROS Normal cardiovascular exam Rhythm:Regular Rate:Normal     Neuro/Psych negative neurological ROS  negative psych ROS   GI/Hepatic negative GI ROS, Neg liver ROS,   Endo/Other  negative endocrine ROS  Renal/GU negative Renal ROS  negative genitourinary   Musculoskeletal negative musculoskeletal ROS (+)   Abdominal   Peds negative pediatric ROS (+)  Hematology negative hematology ROS (+)   Anesthesia Other Findings   Reproductive/Obstetrics negative OB ROS                             Anesthesia Physical Anesthesia Plan  ASA: II  Anesthesia Plan: General   Post-op Pain Management:    Induction: Intravenous and Rapid sequence  PONV Risk Score and Plan: 2 and Ondansetron, Dexamethasone and Treatment may vary due to age or medical condition  Airway Management Planned: Oral ETT  Additional Equipment:   Intra-op Plan:   Post-operative Plan: Extubation in OR  Informed Consent: I have reviewed the patients History and Physical, chart, labs and discussed the procedure including the risks, benefits and alternatives for the proposed anesthesia with the patient or authorized representative who has indicated his/her understanding and acceptance.     Dental advisory given  Plan Discussed with: CRNA and Surgeon  Anesthesia Plan Comments:         Anesthesia Quick Evaluation  
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
## 2017-08-11 NOTE — Anesthesia Procedure Notes (Signed)
Procedure Name: Intubation Date/Time: 08/11/2017 11:07 AM Performed by: Glynda Jaeger, CRNA Pre-anesthesia Checklist: Patient identified, Patient being monitored, Timeout performed, Emergency Drugs available and Suction available Patient Re-evaluated:Patient Re-evaluated prior to induction Oxygen Delivery Method: Circle System Utilized Preoxygenation: Pre-oxygenation with 100% oxygen Induction Type: IV induction Laryngoscope Size: Mac and 3 Grade View: Grade I Tube type: Oral Tube size: 7.5 mm Number of attempts: 1 Airway Equipment and Method: Stylet Placement Confirmation: ETT inserted through vocal cords under direct vision,  positive ETCO2 and breath sounds checked- equal and bilateral Secured at: 22 cm Tube secured with: Tape Dental Injury: Teeth and Oropharynx as per pre-operative assessment
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
## 2017-08-11 NOTE — Anesthesia Postprocedure Evaluation (Signed)
Anesthesia Post Note  Patient: Katie Malone  Procedure(s) Performed: Irrigation and debridment right axillary/laceration, washout of left index finger laceration with closure (Right Breast)     Patient location during evaluation: PACU Anesthesia Type: General Level of consciousness: awake and alert Pain management: pain level controlled Vital Signs Assessment: post-procedure vital signs reviewed and stable Respiratory status: spontaneous breathing, nonlabored ventilation, respiratory function stable and patient connected to nasal cannula oxygen Cardiovascular status: blood pressure returned to baseline and stable Postop Assessment: no apparent nausea or vomiting Anesthetic complications: no    Last Vitals:  Vitals:   08/11/17 1020 08/11/17 1030  BP: 102/76 121/81  Pulse:    Resp: (!) 24   Temp:    SpO2: 100% 100%    Last Pain:  Vitals:   08/11/17 1028  TempSrc:   PainSc: 10-Worst pain ever                 Anayah Arvanitis S

## 2017-08-12 ENCOUNTER — Observation Stay (HOSPITAL_COMMUNITY): Payer: No Typology Code available for payment source

## 2017-08-12 ENCOUNTER — Encounter (HOSPITAL_COMMUNITY): Payer: Self-pay | Admitting: General Surgery

## 2017-08-12 LAB — BASIC METABOLIC PANEL
Anion gap: 6 (ref 5–15)
BUN: <5 mg/dL — ABNORMAL LOW (ref 6–20)
CALCIUM: 8.6 mg/dL — AB (ref 8.9–10.3)
CHLORIDE: 105 mmol/L (ref 101–111)
CO2: 25 mmol/L (ref 22–32)
CREATININE: 0.69 mg/dL (ref 0.44–1.00)
GFR calc non Af Amer: >60 mL/min (ref >60)
Glucose, Bld: 95 mg/dL (ref 65–99)
Potassium: 3.9 mmol/L (ref 3.5–5.1)
SODIUM: 136 mmol/L (ref 135–145)

## 2017-08-12 LAB — HIV ANTIBODY (ROUTINE TESTING W REFLEX): HIV SCREEN 4TH GENERATION: NONREACTIVE

## 2017-08-12 LAB — CBC
HCT: 33.1 % — ABNORMAL LOW (ref 36.0–46.0)
HEMOGLOBIN: 11.1 g/dL — AB (ref 12.0–15.0)
MCH: 30.6 pg (ref 26.0–34.0)
MCHC: 33.5 g/dL (ref 30.0–36.0)
MCV: 91.2 fL (ref 78.0–100.0)
Platelets: 251 10*3/uL (ref 150–400)
RBC: 3.63 MIL/uL — AB (ref 3.87–5.11)
RDW: 12.3 % (ref 11.5–15.5)
WBC: 16.4 10*3/uL — ABNORMAL HIGH (ref 4.0–10.5)

## 2017-08-12 LAB — BLOOD PRODUCT ORDER (VERBAL) VERIFICATION

## 2017-08-12 MED ORDER — BACITRACIN ZINC 500 UNIT/GM EX OINT: TOPICAL_OINTMENT | Freq: Two times a day (BID) | CUTANEOUS | Status: DC

## 2017-08-12 MED ORDER — OXYCODONE HCL 5 MG PO TABS: 5.0000 mg | ORAL_TABLET | ORAL | 0 refills | Status: DC | PRN

## 2017-08-12 MED ORDER — PHENOL 1.4 % MT LIQD: 1.0000 | OROMUCOSAL | Status: DC | PRN

## 2017-08-12 MED ORDER — BACITRACIN ZINC 500 UNIT/GM EX OINT: TOPICAL_OINTMENT | Freq: Two times a day (BID) | CUTANEOUS | 0 refills | Status: DC
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
## 2017-08-12 NOTE — Discharge Instructions (Signed)
Surgical Drain Home Care °Surgical drains are used to remove extra fluid that normally builds up in a surgical wound after surgery. A surgical drain helps to heal a surgical wound. Different kinds of surgical drains include: °· Active drains. These drains use suction to pull drainage away from the surgical wound. Drainage flows through a tube to a container outside of the body. It is important to keep the bulb or the drainage container flat (compressed) at all times, except while you empty it. Flattening the bulb or container creates suction. The two most common types of active drains are bulb drains and Hemovac drains. °· Passive drains. These drains allow fluid to drain naturally, by gravity. Drainage flows through a tube to a bandage (dressing) or a container outside of the body. Passive drains do not need to be emptied. The most common type of passive drain is the Penrose drain. ° °A drain is placed during surgery. Immediately after surgery, drainage is usually bright red and a little thicker than water. The drainage may gradually turn yellow or pink and become thinner. It is likely that your health care provider will remove the drain when the drainage stops or when the amount decreases to 1-2 Tbsp (15-30 mL) during a 24-hour period. °How to care for your surgical drain °· Keep the skin around the drain dry and covered with a dressing at all times. °· Check your drain area every day for signs of infection. Check for: °? More redness, swelling, or pain. °? Pus or a bad smell. °? Cloudy drainage. °Follow instructions from your health care provider about how to take care of your drain and how to change your dressing. Change your dressing at least one time every day. Change it more often if needed to keep the dressing dry. Make sure you: °1. Gather your supplies, including: °? Tape. °? Germ-free cleaning solution (sterile saline). °? Split gauze drain sponge: 4 x 4 inches (10 x 10 cm). °? Gauze square: 4 x 4 inches  (10 x 10 cm). °2. Wash your hands with soap and water before you change your dressing. If soap and water are not available, use hand sanitizer. °3. Remove the old dressing. Avoid using scissors to do that. °4. Use sterile saline to clean your skin around the drain. °5. Place the tube through the slit in a drain sponge. Place the drain sponge so that it covers your wound. °6. Place the gauze square or another drain sponge on top of the drain sponge that is on the wound. Make sure the tube is between those layers. °7. Tape the dressing to your skin. °8. If you have an active bulb or Hemovac drain, tape the drainage tube to your skin 1-2 inches (2.5-5 cm) below the place where the tube enters your body. Taping keeps the tube from pulling on any stitches (sutures) that you have. °9. Wash your hands with soap and water. °10. Write down the color of your drainage and how often you change your dressing. ° °How to empty your active bulb or Hemovac drain °1. Make sure that you have a measuring cup that you can empty your drainage into. °2. Wash your hands with soap and water. If soap and water are not available, use hand sanitizer. °3. Gently move your fingers down the tube while squeezing very lightly. This is called stripping the tube. This clears any drainage, clots, or tissue from the tube. °? Do not pull on the tube. °? You may need to strip   the tube several times every day to keep the tube clear. °4. Open the bulb cap or the drain plug. Do not touch the inside of the cap or the bottom of the plug. °5. Empty all of the drainage into the measuring cup. °6. Compress the bulb or the container and replace the cap or the plug. To compress the bulb or the container, squeeze it firmly in the middle while you close the cap or plug the container. °7. Write down the amount of drainage that you have in each 24-hour period. If you have less than 2 Tbsp (30 mL) of drainage during 24 hours, contact your health care  provider. °8. Flush the drainage down the toilet. °9. Wash your hands with soap and water. °Contact a health care provider if: °· You have more redness, swelling, or pain around your drain area. °· The amount of drainage that you have is increasing instead of decreasing. °· You have pus or a bad smell coming from your drain area. °· You have a fever. °· You have drainage that is cloudy. °· There is a sudden stop or a sudden decrease in the amount of drainage that you have. °· Your tube falls out. °· Your active drain does not stay compressed after you empty it. °This information is not intended to replace advice given to you by your health care provider. Make sure you discuss any questions you have with your health care provider. °Document Released: 07/31/2000 Document Revised: 01/09/2016 Document Reviewed: 02/20/2015 °Elsevier Interactive Patient Education © 2018 [employer] ° °

## 2017-08-12 NOTE — Progress Notes (Signed)
Patient discharged to home with instructions. 
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
## 2017-08-13 ENCOUNTER — Encounter (HOSPITAL_COMMUNITY): Payer: Self-pay | Admitting: Emergency Medicine

## 2018-02-23 ENCOUNTER — Encounter (HOSPITAL_COMMUNITY): Payer: Self-pay

## 2018-02-23 ENCOUNTER — Other Ambulatory Visit: Payer: Self-pay

## 2018-02-23 ENCOUNTER — Emergency Department (HOSPITAL_COMMUNITY)
Admission: EM | Admit: 2018-02-23 | Discharge: 2018-02-24 | Disposition: A | Discharge status: Home / Self Care | Payer: Self-pay | Attending: Emergency Medicine | Admitting: Emergency Medicine

## 2018-02-23 DIAGNOSIS — F17210 Nicotine dependence, cigarettes, uncomplicated: Secondary | ICD-10-CM | POA: Insufficient documentation

## 2018-02-23 DIAGNOSIS — R1030 Lower abdominal pain, unspecified: Secondary | ICD-10-CM | POA: Insufficient documentation

## 2018-02-23 DIAGNOSIS — N938 Other specified abnormal uterine and vaginal bleeding: Secondary | ICD-10-CM | POA: Insufficient documentation

## 2018-02-23 LAB — I-STAT BETA HCG BLOOD, ED (MC, WL, AP ONLY): I-stat hCG, quantitative: <5 m[IU]/mL (ref <5)

## 2018-02-23 NOTE — ED Triage Notes (Signed)
Pt states that she has been having heavy vaginal bleeding for the past two day, with lower abd pain, going threw a tampon and pad within and hour with clots, hx of the same, denies dysuria or vaginal discharge.

## 2018-02-24 LAB — BASIC METABOLIC PANEL
Anion gap: 8 (ref 5–15)
BUN: 13 mg/dL (ref 6–20)
CALCIUM: 9.1 mg/dL (ref 8.9–10.3)
CO2: 27 mmol/L (ref 22–32)
CREATININE: 0.84 mg/dL (ref 0.44–1.00)
Chloride: 103 mmol/L (ref 98–111)
GFR calc non Af Amer: >60 mL/min (ref >60)
Glucose, Bld: 112 mg/dL — ABNORMAL HIGH (ref 70–99)
Potassium: 3.8 mmol/L (ref 3.5–5.1)
SODIUM: 138 mmol/L (ref 135–145)

## 2018-02-24 LAB — CBC WITH DIFFERENTIAL/PLATELET
Abs Immature Granulocytes: 0 10*3/uL (ref 0.0–0.1)
BASOS ABS: 0.1 10*3/uL (ref 0.0–0.1)
BASOS PCT: 1 %
EOS ABS: 0.1 10*3/uL (ref 0.0–0.7)
Eosinophils Relative: 1 %
HEMATOCRIT: 38.8 % (ref 36.0–46.0)
Hemoglobin: 12.4 g/dL (ref 12.0–15.0)
Immature Granulocytes: 0 %
Lymphocytes Relative: 27 %
Lymphs Abs: 2.9 10*3/uL (ref 0.7–4.0)
MCH: 30.1 pg (ref 26.0–34.0)
MCHC: 32 g/dL (ref 30.0–36.0)
MCV: 94.2 fL (ref 78.0–100.0)
Monocytes Absolute: 0.5 10*3/uL (ref 0.1–1.0)
Monocytes Relative: 4 %
Neutro Abs: 7.3 10*3/uL (ref 1.7–7.7)
Neutrophils Relative %: 67 %
PLATELETS: 317 10*3/uL (ref 150–400)
RBC: 4.12 MIL/uL (ref 3.87–5.11)
RDW: 13.2 % (ref 11.5–15.5)
WBC: 10.9 10*3/uL — AB (ref 4.0–10.5)

## 2018-02-24 MED ORDER — OXYCODONE-ACETAMINOPHEN 5-325 MG PO TABS: 1.0000 | ORAL_TABLET | Freq: Four times a day (QID) | ORAL | 0 refills | Status: DC | PRN

## 2018-02-24 MED ORDER — SODIUM CHLORIDE 0.9 % IV BOLUS: 1000.0000 mL | Freq: Once | INTRAVENOUS | Status: DC

## 2018-02-24 MED ORDER — OXYCODONE-ACETAMINOPHEN 5-325 MG PO TABS
1.0000 | ORAL_TABLET | Freq: Once | ORAL | Status: AC
  Administered 2018-02-24: 1 via ORAL
  Filled 2018-02-24: qty 1
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
## 2018-02-24 NOTE — ED Notes (Signed)
Patient verbalizes understanding of discharge instructions. Opportunity for questioning and answers were provided. Armband removed by staff, pt discharged from ED.  

## 2018-03-08 ENCOUNTER — Emergency Department (HOSPITAL_COMMUNITY)
Admission: EM | Admit: 2018-03-08 | Discharge: 2018-03-09 | Discharge status: Against Medical Advice | Payer: Self-pay | Attending: Emergency Medicine | Admitting: Emergency Medicine

## 2018-03-08 ENCOUNTER — Encounter (HOSPITAL_COMMUNITY): Payer: Self-pay

## 2018-03-08 DIAGNOSIS — N939 Abnormal uterine and vaginal bleeding, unspecified: Secondary | ICD-10-CM | POA: Insufficient documentation

## 2018-03-08 DIAGNOSIS — R0602 Shortness of breath: Secondary | ICD-10-CM | POA: Insufficient documentation

## 2018-03-08 DIAGNOSIS — Z5320 Procedure and treatment not carried out because of patient's decision for unspecified reasons: Secondary | ICD-10-CM | POA: Insufficient documentation

## 2018-03-08 DIAGNOSIS — R42 Dizziness and giddiness: Secondary | ICD-10-CM | POA: Insufficient documentation

## 2018-03-08 LAB — CBC
HCT: 37.2 % (ref 36.0–46.0)
HEMOGLOBIN: 11.9 g/dL — AB (ref 12.0–15.0)
MCH: 29.8 pg (ref 26.0–34.0)
MCHC: 32 g/dL (ref 30.0–36.0)
MCV: 93 fL (ref 78.0–100.0)
Platelets: 291 10*3/uL (ref 150–400)
RBC: 4 MIL/uL (ref 3.87–5.11)
RDW: 13.2 % (ref 11.5–15.5)
WBC: 9.9 10*3/uL (ref 4.0–10.5)

## 2018-03-08 LAB — URINALYSIS, ROUTINE W REFLEX MICROSCOPIC
BACTERIA UA: NONE SEEN
BILIRUBIN URINE: NEGATIVE
Glucose, UA: NEGATIVE mg/dL
Ketones, ur: NEGATIVE mg/dL
Leukocytes, UA: NEGATIVE
Nitrite: NEGATIVE
PH: 5 (ref 5.0–8.0)
Protein, ur: NEGATIVE mg/dL
SPECIFIC GRAVITY, URINE: 1.021 (ref 1.005–1.030)

## 2018-03-08 LAB — BASIC METABOLIC PANEL
ANION GAP: 8 (ref 5–15)
BUN: 13 mg/dL (ref 6–20)
CHLORIDE: 108 mmol/L (ref 98–111)
CO2: 24 mmol/L (ref 22–32)
Calcium: 9.2 mg/dL (ref 8.9–10.3)
Creatinine, Ser: 0.78 mg/dL (ref 0.44–1.00)
GFR calc non Af Amer: >60 mL/min (ref >60)
Glucose, Bld: 79 mg/dL (ref 70–99)
POTASSIUM: 4 mmol/L (ref 3.5–5.1)
SODIUM: 140 mmol/L (ref 135–145)

## 2018-03-08 LAB — I-STAT BETA HCG BLOOD, ED (MC, WL, AP ONLY)

## 2018-03-08 NOTE — ED Provider Notes (Signed)
Patient placed in Quick Look pathway, seen and evaluated   Chief Complaint: vaginal bleeding  HPI:   Pt reports she has been bleeding for 3 weeks.  Pt complains of feeling weak  ROS: muscle cramps,   Physical Exam:   Gen: No distress  Neuro: Awake and Alert  Skin: Warm    Focused Exam: abdomen soft nontender   Initiation of care has begun. The patient has been counseled on the process, plan, and necessity for staying for the completion/evaluation, and the remainder of the medical screening examination   Osie CheeksSofia, Leslie K, PA-C 03/08/18 2045    Tegeler, Canary Brimhristopher J, MD 03/09/18 [phone]

## 2018-03-08 NOTE — ED Triage Notes (Signed)
Pt states that she was seen here two weeks ago for the same, has continued to have heavy vaginal bleeding that has been going on for three week, going threw 2 pad an hour, bleeding threw at night, large clots, now having dizziness and SOB

## 2018-03-08 NOTE — ED Notes (Signed)
Pt called for vitals x3. Pt seen leaving waiting room earlier.

## 2018-03-09 NOTE — ED Notes (Signed)
Up to pt to get vitals. Pt informed this EMT that she is leaving and does not wish to have her vitals taken at this time.

## 2018-04-12 ENCOUNTER — Encounter: Payer: Self-pay | Admitting: Obstetrics & Gynecology

## 2018-06-06 ENCOUNTER — Encounter (HOSPITAL_COMMUNITY): Payer: Self-pay | Admitting: Oncology

## 2018-06-06 ENCOUNTER — Emergency Department (HOSPITAL_COMMUNITY)
Admission: EM | Admit: 2018-06-06 | Discharge: 2018-06-06 | Disposition: A | Discharge status: Home / Self Care | Payer: Self-pay | Attending: Emergency Medicine | Admitting: Emergency Medicine

## 2018-06-06 ENCOUNTER — Other Ambulatory Visit: Payer: Self-pay

## 2018-06-06 DIAGNOSIS — F17210 Nicotine dependence, cigarettes, uncomplicated: Secondary | ICD-10-CM | POA: Insufficient documentation

## 2018-06-06 DIAGNOSIS — Z79899 Other long term (current) drug therapy: Secondary | ICD-10-CM | POA: Insufficient documentation

## 2018-06-06 DIAGNOSIS — M62830 Muscle spasm of back: Secondary | ICD-10-CM | POA: Insufficient documentation

## 2018-06-06 MED ORDER — OXYCODONE HCL 5 MG PO TABS
5.0000 mg | ORAL_TABLET | Freq: Once | ORAL | Status: AC
  Administered 2018-06-06: 5 mg via ORAL
  Filled 2018-06-06: qty 1

## 2018-06-06 MED ORDER — METHOCARBAMOL 500 MG PO TABS
500.0000 mg | ORAL_TABLET | Freq: Once | ORAL | Status: AC
  Administered 2018-06-06: 500 mg via ORAL
  Filled 2018-06-06: qty 1

## 2018-06-06 MED ORDER — NAPROXEN 500 MG PO TABS: 500.0000 mg | ORAL_TABLET | Freq: Two times a day (BID) | ORAL | 0 refills | Status: DC

## 2018-06-06 MED ORDER — KETOROLAC TROMETHAMINE 30 MG/ML IJ SOLN
30.0000 mg | Freq: Once | INTRAMUSCULAR | Status: AC
  Administered 2018-06-06: 30 mg via INTRAMUSCULAR
  Filled 2018-06-06: qty 1

## 2018-06-06 MED ORDER — LIDOCAINE 5 % EX PTCH: 1.0000 | MEDICATED_PATCH | CUTANEOUS | 0 refills | Status: DC

## 2018-06-06 MED ORDER — METHOCARBAMOL 500 MG PO TABS: 500.0000 mg | ORAL_TABLET | Freq: Two times a day (BID) | ORAL | 0 refills | Status: DC

## 2018-06-06 NOTE — ED Notes (Signed)
Pt discharged from ED; instructions provided and scripts given; Pt encouraged to return to ED if symptoms worsen and to f/u with PCP; Pt verbalized understanding of all instructions 

## 2018-06-06 NOTE — ED Triage Notes (Signed)
Pt c/o back pain/spasms to the mid right portion of her back. Pt rates pain 10/10.
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
## 2018-06-06 NOTE — Discharge Instructions (Addendum)
Please take medication prescribed. Return to ED for worsening symptoms, injuries or falls, losing control of your bowels or bladder, chest pain, shortness of breath, lightheadedness.

## 2019-01-08 ENCOUNTER — Other Ambulatory Visit: Payer: Self-pay

## 2019-01-08 ENCOUNTER — Encounter (HOSPITAL_COMMUNITY): Payer: Self-pay

## 2019-01-08 ENCOUNTER — Emergency Department (HOSPITAL_COMMUNITY)
Admission: EM | Admit: 2019-01-08 | Discharge: 2019-01-08 | Disposition: A | Discharge status: Home / Self Care | Payer: Self-pay | Attending: Emergency Medicine | Admitting: Emergency Medicine

## 2019-01-08 DIAGNOSIS — N939 Abnormal uterine and vaginal bleeding, unspecified: Secondary | ICD-10-CM

## 2019-01-08 DIAGNOSIS — F17210 Nicotine dependence, cigarettes, uncomplicated: Secondary | ICD-10-CM | POA: Insufficient documentation

## 2019-01-08 DIAGNOSIS — B9689 Other specified bacterial agents as the cause of diseases classified elsewhere: Secondary | ICD-10-CM

## 2019-01-08 DIAGNOSIS — N760 Acute vaginitis: Secondary | ICD-10-CM | POA: Insufficient documentation

## 2019-01-08 LAB — CBC WITH DIFFERENTIAL/PLATELET
Abs Immature Granulocytes: 0.02 10*3/uL (ref 0.00–0.07)
Basophils Absolute: 0 10*3/uL (ref 0.0–0.1)
Basophils Relative: 0 %
Eosinophils Absolute: 0.1 10*3/uL (ref 0.0–0.5)
Eosinophils Relative: 1 %
HCT: 35.4 % — ABNORMAL LOW (ref 36.0–46.0)
Hemoglobin: 12.1 g/dL (ref 12.0–15.0)
Immature Granulocytes: 0 %
Lymphocytes Relative: 29 %
Lymphs Abs: 3.1 10*3/uL (ref 0.7–4.0)
MCH: 31.5 pg (ref 26.0–34.0)
MCHC: 34.2 g/dL (ref 30.0–36.0)
MCV: 92.2 fL (ref 80.0–100.0)
Monocytes Absolute: 0.7 10*3/uL (ref 0.1–1.0)
Monocytes Relative: 6 %
Neutro Abs: 7 10*3/uL (ref 1.7–7.7)
Neutrophils Relative %: 64 %
Platelets: 311 10*3/uL (ref 150–400)
RBC: 3.84 MIL/uL — ABNORMAL LOW (ref 3.87–5.11)
RDW: 12.3 % (ref 11.5–15.5)
WBC: 10.9 10*3/uL — ABNORMAL HIGH (ref 4.0–10.5)
nRBC: 0 % (ref 0.0–0.2)

## 2019-01-08 LAB — WET PREP, GENITAL
Sperm: NONE SEEN
Trich, Wet Prep: NONE SEEN
Yeast Wet Prep HPF POC: NONE SEEN

## 2019-01-08 LAB — I-STAT BETA HCG BLOOD, ED (MC, WL, AP ONLY): I-stat hCG, quantitative: <5 m[IU]/mL (ref <5)

## 2019-01-08 LAB — PROTIME-INR
INR: 1 (ref 0.8–1.2)
Prothrombin Time: 12.9 seconds (ref 11.4–15.2)

## 2019-01-08 MED ORDER — MEDROXYPROGESTERONE ACETATE 5 MG PO TABS: 10.0000 mg | ORAL_TABLET | Freq: Every day | ORAL | 0 refills | Status: DC

## 2019-01-08 MED ORDER — METRONIDAZOLE 500 MG PO TABS: 500.0000 mg | ORAL_TABLET | Freq: Two times a day (BID) | ORAL | 0 refills | Status: DC
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
## 2019-01-08 NOTE — ED Triage Notes (Addendum)
Pt states she has been vaginally bleeding for "the whole month of May". Pt states it started normal, then stopped for 4 days, then continued way heavier than noraml. Pt states that she has had large clots "the size of golf balls". Pt states she has a hx of anemia and is concerned. No pain associated. Hx of ectopic pregnancy 2013. Pt had tubes removed.

## 2019-01-08 NOTE — Discharge Instructions (Addendum)
Take the Provera as prescribed.  You can also take ibuprofen 600 mg every 8 hours for pain and cramping.

## 2019-01-10 LAB — GC/CHLAMYDIA PROBE AMP (~~LOC~~) NOT AT ARMC
Chlamydia: NEGATIVE
Neisseria Gonorrhea: NEGATIVE

## 2019-04-03 ENCOUNTER — Encounter (HOSPITAL_COMMUNITY): Payer: Self-pay

## 2019-04-03 ENCOUNTER — Ambulatory Visit (HOSPITAL_COMMUNITY)
Admission: EM | Admit: 2019-04-03 | Discharge: 2019-04-03 | Disposition: A | Discharge status: Home / Self Care | Payer: Self-pay | Attending: Emergency Medicine | Admitting: Emergency Medicine

## 2019-04-03 DIAGNOSIS — N921 Excessive and frequent menstruation with irregular cycle: Secondary | ICD-10-CM | POA: Insufficient documentation

## 2019-04-03 DIAGNOSIS — G44209 Tension-type headache, unspecified, not intractable: Secondary | ICD-10-CM | POA: Insufficient documentation

## 2019-04-03 DIAGNOSIS — R42 Dizziness and giddiness: Secondary | ICD-10-CM | POA: Insufficient documentation

## 2019-04-03 LAB — BASIC METABOLIC PANEL
Anion gap: 7 (ref 5–15)
BUN: 6 mg/dL (ref 6–20)
CO2: 26 mmol/L (ref 22–32)
Calcium: 8.8 mg/dL — ABNORMAL LOW (ref 8.9–10.3)
Chloride: 104 mmol/L (ref 98–111)
Creatinine, Ser: 0.71 mg/dL (ref 0.44–1.00)
GFR calc Af Amer: >60 mL/min (ref >60)
GFR calc non Af Amer: >60 mL/min (ref >60)
Glucose, Bld: 96 mg/dL (ref 70–99)
Potassium: 3.9 mmol/L (ref 3.5–5.1)
Sodium: 137 mmol/L (ref 135–145)

## 2019-04-03 LAB — CBC
HCT: 34.4 % — ABNORMAL LOW (ref 36.0–46.0)
Hemoglobin: 11.6 g/dL — ABNORMAL LOW (ref 12.0–15.0)
MCH: 31.2 pg (ref 26.0–34.0)
MCHC: 33.7 g/dL (ref 30.0–36.0)
MCV: 92.5 fL (ref 80.0–100.0)
Platelets: 284 10*3/uL (ref 150–400)
RBC: 3.72 MIL/uL — ABNORMAL LOW (ref 3.87–5.11)
RDW: 12.5 % (ref 11.5–15.5)
WBC: 9.1 10*3/uL (ref 4.0–10.5)
nRBC: 0 % (ref 0.0–0.2)

## 2019-04-03 MED ORDER — KETOROLAC TROMETHAMINE 60 MG/2ML IM SOLN
60.0000 mg | Freq: Once | INTRAMUSCULAR | Status: AC
  Administered 2019-04-03: 60 mg via INTRAMUSCULAR

## 2019-04-03 MED ORDER — MEGESTROL ACETATE 40 MG PO TABS: 40.0000 mg | ORAL_TABLET | Freq: Two times a day (BID) | ORAL | 0 refills | Status: DC

## 2019-04-03 MED ORDER — METOCLOPRAMIDE HCL 5 MG/ML IJ SOLN
INTRAMUSCULAR | Status: AC
  Filled 2019-04-03: qty 2

## 2019-04-03 MED ORDER — DEXAMETHASONE SODIUM PHOSPHATE 10 MG/ML IJ SOLN
10.0000 mg | Freq: Once | INTRAMUSCULAR | Status: AC
  Administered 2019-04-03: 10 mg via INTRAMUSCULAR

## 2019-04-03 MED ORDER — METOCLOPRAMIDE HCL 5 MG/ML IJ SOLN
5.0000 mg | Freq: Once | INTRAMUSCULAR | Status: AC
  Administered 2019-04-03: 10 mg via INTRAMUSCULAR

## 2019-04-03 MED ORDER — KETOROLAC TROMETHAMINE 60 MG/2ML IM SOLN
INTRAMUSCULAR | Status: AC
  Filled 2019-04-03: qty 2

## 2019-04-03 MED ORDER — NAPROXEN 500 MG PO TABS: 500.0000 mg | ORAL_TABLET | Freq: Two times a day (BID) | ORAL | 0 refills | Status: DC

## 2019-04-03 MED ORDER — DEXAMETHASONE SODIUM PHOSPHATE 10 MG/ML IJ SOLN
INTRAMUSCULAR | Status: AC
  Filled 2019-04-03: qty 1

## 2019-04-03 MED ORDER — ONDANSETRON 4 MG PO TBDP: 4.0000 mg | ORAL_TABLET | Freq: Three times a day (TID) | ORAL | 0 refills | Status: DC | PRN

## 2019-04-03 NOTE — Discharge Instructions (Signed)
We gave you Toradol, Decadron and Reglan today for your headache.  We are checking the blood work to check electrolytes and hemoglobin as cause of lightheadedness  Please air out windows, drink plenty of fluids  Monitor for symptoms to resolve as fumes dissipate. May use Naprosyn as needed for further headaches, Zofran for any nausea  If your menstrual cycle continues to follow heavily in 1 week you may begin Megace twice daily until bleeding stops. Please follow-up with OB/GYN for further evaluation/management of heavy bleeding  Please follow-up if your lightheadedness worsens, developing other symptoms with this
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
## 2019-04-03 NOTE — ED Triage Notes (Signed)
Pt reports painting her house over the weekend. States that she began feeling dizzy/lightheaded the day she started painting. Pt also reports headache. States that she did not open any windows to ventilate while she was painting. No focal neuro deficits during triage.

## 2019-04-05 ENCOUNTER — Telehealth (HOSPITAL_COMMUNITY): Payer: Self-pay | Admitting: Emergency Medicine

## 2019-04-05 NOTE — Telephone Encounter (Signed)
Normal labs. Attempted to reach patient. No answer at this time. 

## 2019-04-10 ENCOUNTER — Other Ambulatory Visit: Payer: Self-pay

## 2019-04-10 ENCOUNTER — Ambulatory Visit (HOSPITAL_COMMUNITY)
Admission: EM | Admit: 2019-04-10 | Discharge: 2019-04-10 | Disposition: A | Discharge status: Home / Self Care | Payer: HRSA Program | Attending: Family Medicine | Admitting: Family Medicine

## 2019-04-10 ENCOUNTER — Encounter (HOSPITAL_COMMUNITY): Payer: Self-pay | Admitting: Emergency Medicine

## 2019-04-10 DIAGNOSIS — R509 Fever, unspecified: Secondary | ICD-10-CM | POA: Diagnosis present

## 2019-04-10 DIAGNOSIS — Z20822 Contact with and (suspected) exposure to covid-19: Secondary | ICD-10-CM

## 2019-04-10 DIAGNOSIS — G43819 Other migraine, intractable, without status migrainosus: Secondary | ICD-10-CM

## 2019-04-10 DIAGNOSIS — F17210 Nicotine dependence, cigarettes, uncomplicated: Secondary | ICD-10-CM | POA: Insufficient documentation

## 2019-04-10 DIAGNOSIS — Z20828 Contact with and (suspected) exposure to other viral communicable diseases: Secondary | ICD-10-CM | POA: Diagnosis not present

## 2019-04-10 DIAGNOSIS — R5383 Other fatigue: Secondary | ICD-10-CM

## 2019-04-10 MED ORDER — KETOROLAC TROMETHAMINE 60 MG/2ML IM SOLN
INTRAMUSCULAR | Status: AC
  Filled 2019-04-10: qty 2

## 2019-04-10 MED ORDER — KETOROLAC TROMETHAMINE 60 MG/2ML IM SOLN: 60.0000 mg | Freq: Once | INTRAMUSCULAR | Status: DC

## 2019-04-10 MED ORDER — METOCLOPRAMIDE HCL 5 MG/ML IJ SOLN
INTRAMUSCULAR | Status: AC
  Filled 2019-04-10: qty 2

## 2019-04-10 MED ORDER — DEXAMETHASONE SODIUM PHOSPHATE 10 MG/ML IJ SOLN
INTRAMUSCULAR | Status: AC
  Filled 2019-04-10: qty 1

## 2019-04-10 MED ORDER — METOCLOPRAMIDE HCL 5 MG/ML IJ SOLN
5.0000 mg | Freq: Once | INTRAMUSCULAR | Status: AC
  Administered 2019-04-10: 5 mg via INTRAMUSCULAR

## 2019-04-10 MED ORDER — DEXAMETHASONE SODIUM PHOSPHATE 10 MG/ML IJ SOLN
10.0000 mg | Freq: Once | INTRAMUSCULAR | Status: AC
  Administered 2019-04-10: 10 mg via INTRAMUSCULAR

## 2019-04-10 MED ORDER — KETOROLAC TROMETHAMINE 60 MG/2ML IM SOLN
60.0000 mg | Freq: Once | INTRAMUSCULAR | Status: AC
  Administered 2019-04-10: 60 mg via INTRAMUSCULAR
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
## 2019-04-10 NOTE — ED Triage Notes (Signed)
PT reports headache and fever that started yesterday. 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

## 2019-04-10 NOTE — Discharge Instructions (Signed)
Drink plenty of fluids Take Tylenol for pain and fever May take over-the-counter cough and cold medicine if needed You must quarantine and stay at home until your test results are available     Person Under Monitoring Name: Katie GoodnessMarcia T Malone  Location: [address] [place] [id number]   Infection Prevention Recommendations for Individuals Confirmed to have, or Being Evaluated for, 2019 Novel Coronavirus (COVID-19) Infection Who Receive Care at Home  Individuals who are confirmed to have, or are being evaluated for, COVID-19 should follow the prevention steps below until a healthcare provider or local or state health department says they can return to normal activities.  Stay home except to get medical care You should restrict activities outside your home, except for getting medical care. Do not go to work, school, or public areas, and do not use public transportation or taxis.  Call ahead before visiting your doctor Before your medical appointment, call the healthcare provider and tell them that you have, or are being evaluated for, COVID-19 infection. This will help the healthcare providers office take steps to keep other people from getting infected. Ask your healthcare provider to call the local or state health department.  Monitor your symptoms Seek prompt medical attention if your illness is worsening (e.g., difficulty breathing). Before going to your medical appointment, call the healthcare provider and tell them that you have, or are being evaluated for, COVID-19 infection. Ask your healthcare provider to call the local or state health department.  Wear a facemask You should wear a facemask that covers your nose and mouth when you are in the same room with other people and when you visit a healthcare provider. People who live with or visit you should also wear a facemask while they are in the same room with you.  Separate yourself from other people in your home As  much as possible, you should stay in a different room from other people in your home. Also, you should use a separate bathroom, if available.  Avoid sharing household items You should not share dishes, drinking glasses, cups, eating utensils, towels, bedding, or other items with other people in your home. After using these items, you should wash them thoroughly with soap and water.  Cover your coughs and sneezes Cover your mouth and nose with a tissue when you cough or sneeze, or you can cough or sneeze into your sleeve. Throw used tissues in a lined trash can, and immediately wash your hands with soap and water for at least 20 seconds or use an alcohol-based hand rub.  Wash your [employer] Corporationhands Wash your hands often and thoroughly with soap and water for at least 20 seconds. You can use an alcohol-based hand sanitizer if soap and water are not available and if your hands are not visibly dirty. Avoid touching your eyes, nose, and mouth with unwashed hands.   Prevention Steps for Caregivers and Household Members of Individuals Confirmed to have, or Being Evaluated for, COVID-19 Infection Being Cared for in the Home  If you live with, or provide care at home for, a person confirmed to have, or being evaluated for, COVID-19 infection please follow these guidelines to prevent infection:  Follow healthcare providers instructions Make sure that you understand and can help the patient follow any healthcare provider instructions for all care.  Provide for the patients basic needs You should help the patient with basic needs in the home and provide support for getting groceries, prescriptions, and other personal needs.  Monitor  the patients symptoms If they are getting sicker, call his or her medical provider and tell them that the patient has, or is being evaluated for, COVID-19 infection. This will help the healthcare providers office take steps to keep other people from getting infected. Ask  the healthcare provider to call the local or state health department.  Limit the number of people who have contact with the patient If possible, have only one caregiver for the patient. Other household members should stay in another home or place of residence. If this is not possible, they should stay in another room, or be separated from the patient as much as possible. Use a separate bathroom, if available. Restrict visitors who do not have an essential need to be in the home.  Keep older adults, very young children, and other sick people away from the patient Keep older adults, very young children, and those who have compromised immune systems or chronic health conditions away from the patient. This includes people with chronic heart, lung, or kidney conditions, diabetes, and cancer.  Ensure good ventilation Make sure that shared spaces in the home have good air flow, such as from an air conditioner or an opened window, weather permitting.  Wash your hands often Wash your hands often and thoroughly with soap and water for at least 20 seconds. You can use an alcohol based hand sanitizer if soap and water are not available and if your hands are not visibly dirty. Avoid touching your eyes, nose, and mouth with unwashed hands. Use disposable paper towels to dry your hands. If not available, use dedicated cloth towels and replace them when they become wet.  Wear a facemask and gloves Wear a disposable facemask at all times in the room and gloves when you touch or have contact with the patients blood, body fluids, and/or secretions or excretions, such as sweat, saliva, sputum, nasal mucus, vomit, urine, or feces.  Ensure the mask fits over your nose and mouth tightly, and do not touch it during use. Throw out disposable facemasks and gloves after using them. Do not reuse. Wash your hands immediately after removing your facemask and gloves. If your personal clothing becomes contaminated,  carefully remove clothing and launder. Wash your hands after handling contaminated clothing. Place all used disposable facemasks, gloves, and other waste in a lined container before disposing them with other household waste. Remove gloves and wash your hands immediately after handling these items.  Do not share dishes, glasses, or other household items with the patient Avoid sharing household items. You should not share dishes, drinking glasses, cups, eating utensils, towels, bedding, or other items with a patient who is confirmed to have, or being evaluated for, COVID-19 infection. After the person uses these items, you should wash them thoroughly with soap and water.  Wash laundry thoroughly Immediately remove and wash clothes or bedding that have blood, body fluids, and/or secretions or excretions, such as sweat, saliva, sputum, nasal mucus, vomit, urine, or feces, on them. Wear gloves when handling laundry from the patient. Read and follow directions on labels of laundry or clothing items and detergent. In general, wash and dry with the warmest temperatures recommended on the label.  Clean all areas the individual has used often Clean all touchable surfaces, such as counters, tabletops, doorknobs, bathroom fixtures, toilets, phones, keyboards, tablets, and bedside tables, every day. Also, clean any surfaces that may have blood, body fluids, and/or secretions or excretions on them. Wear gloves when cleaning surfaces the patient has come  in contact with. Use a diluted bleach solution (e.g., dilute bleach with 1 part bleach and 10 parts water) or a household disinfectant with a label that says EPA-registered for coronaviruses. To make a bleach solution at home, add 1 tablespoon of bleach to 1 quart (4 cups) of water. For a larger supply, add  cup of bleach to 1 gallon (16 cups) of water. Read labels of cleaning products and follow recommendations provided on product labels. Labels contain  instructions for safe and effective use of the cleaning product including precautions you should take when applying the product, such as wearing gloves or eye protection and making sure you have good ventilation during use of the product. Remove gloves and wash hands immediately after cleaning.  Monitor yourself for signs and symptoms of illness Caregivers and household members are considered close contacts, should monitor their health, and will be asked to limit movement outside of the home to the extent possible. Follow the monitoring steps for close contacts listed on the symptom monitoring form.   ? If you have additional questions, contact your local health department or call the epidemiologist on call at [phone] (available 24/7). ? This guidance is subject to change. For the most up-to-date guidance from [facility], please refer to their website: [email or web address]

## 2019-04-12 LAB — NOVEL CORONAVIRUS, NAA (HOSP ORDER, SEND-OUT TO REF LAB; TAT 18-24 HRS): SARS-CoV-2, NAA: NOT DETECTED

## 2020-05-10 ENCOUNTER — Encounter (HOSPITAL_COMMUNITY): Payer: Self-pay

## 2020-05-10 ENCOUNTER — Other Ambulatory Visit: Payer: Self-pay

## 2020-05-10 ENCOUNTER — Ambulatory Visit (HOSPITAL_COMMUNITY)
Admission: EM | Admit: 2020-05-10 | Discharge: 2020-05-10 | Disposition: A | Discharge status: Home / Self Care | Payer: Self-pay | Attending: Urgent Care | Admitting: Urgent Care

## 2020-05-10 DIAGNOSIS — K0889 Other specified disorders of teeth and supporting structures: Secondary | ICD-10-CM

## 2020-05-10 DIAGNOSIS — Z8669 Personal history of other diseases of the nervous system and sense organs: Secondary | ICD-10-CM

## 2020-05-10 DIAGNOSIS — R519 Headache, unspecified: Secondary | ICD-10-CM

## 2020-05-10 MED ORDER — SUMATRIPTAN SUCCINATE 50 MG PO TABS: ORAL_TABLET | ORAL | 0 refills | Status: DC

## 2020-05-10 MED ORDER — KETOROLAC TROMETHAMINE 60 MG/2ML IM SOLN
60.0000 mg | Freq: Once | INTRAMUSCULAR | Status: AC
  Administered 2020-05-10: 60 mg via INTRAMUSCULAR

## 2020-05-10 MED ORDER — KETOROLAC TROMETHAMINE 60 MG/2ML IM SOLN
INTRAMUSCULAR | Status: AC
  Filled 2020-05-10: qty 2

## 2020-05-10 MED ORDER — NAPROXEN 500 MG PO TABS: 500.0000 mg | ORAL_TABLET | Freq: Two times a day (BID) | ORAL | 0 refills | Status: DC

## 2020-05-10 MED ORDER — HYDROCODONE-ACETAMINOPHEN 5-325 MG PO TABS: 2.0000 | ORAL_TABLET | ORAL | 0 refills | Status: DC | PRN

## 2020-05-10 MED ORDER — CLINDAMYCIN HCL 300 MG PO CAPS: 300.0000 mg | ORAL_CAPSULE | Freq: Three times a day (TID) | ORAL | 0 refills | Status: DC

## 2020-05-10 NOTE — ED Triage Notes (Signed)
Pt presents with left side dental pain  and migraine X 3 days.

## 2020-05-10 NOTE — ED Provider Notes (Signed)
[facility]   MRN: [id number] DOB: [birth year]  Subjective:   ELYSSE Malone is a 44 y.o. female presenting for 3-day history of acute onset recurrent severe left-sided dental pain with radiation toward her ear, neck and left side of her face.  Patient states that she has had intermittent photosensitivity.  No nausea, confusion.  Denies swelling of the gums.  Has known dental issues and has an appointment scheduled in 2 weeks for tooth extraction.  Had previously seen a different dentist and was prescribed amoxicillin antibiotic prophylaxis, finished this course several weeks ago.  No current facility-administered medications for this encounter.  Current Outpatient Medications:  .  acetaminophen (TYLENOL) 500 MG tablet, Take 1,000-1,500 mg by mouth every 6 (six) hours as needed for headache. , Disp: , Rfl:    Allergies  Allergen Reactions  . Shrimp [Shellfish Allergy] Swelling    Past Medical History:  Diagnosis Date  . Complication of anesthesia 02/2013   seizure immediately after epidural started  . History of anemia    during pregnancy  . Irregular periods   . Migraines   . Pseudoseizures    none since 10/2011  . Right clavicle fracture 11/29/2013  . Seizures (HCC) 02/2013   x 1 - onset after starting epidural anesthesia  . Seizures (HCC)    last seizure 2014, does NOT take antiepileptic medications     Past Surgical History:  Procedure Laterality Date  . AXILLARY LYMPH NODE DISSECTION Right 08/11/2017   Procedure: Irrigation and debridment right axillary/laceration, washout of left index finger laceration with closure;  Surgeon: Jimmye Norman, MD;  Location: [facility] OR;  Service: General;  Laterality: Right;  . CLAVICLE SURGERY    . LAPAROSCOPIC BILATERAL SALPINGECTOMY Left 08/03/2013   Procedure: LEFT SALPINGECTOMY;  Surgeon: Willodean Rosenthal, MD;  Location: [facility] ORS;  Service: Gynecology;  Laterality: Left;  . LAPAROSCOPY  11/05/2011   Procedure:  LAPAROSCOPY OPERATIVE;  Surgeon: Jerene Bears, MD;  Location: [facility];  Service: Gynecology;  Laterality: N/A;  Right Salpingectomy  . ORIF CLAVICULAR FRACTURE Right 12/04/2013   Procedure: OPEN REDUCTION INTERNAL FIXATION (ORIF) RIGHT CLAVICLE FRACTURE;  Surgeon: Sheral Apley, MD;  Location:  SUR[facility]ERY CENTER;  Service: Orthopedics;  Laterality: Right;  . WISDOM TOOTH EXTRACTION      Family History  Problem Relation Age of Onset  . Heart disease Mother   . Hypertension Mother   . Healthy Father     Social History   Tobacco Use  . Smoking status: Current Every Day Smoker    Packs/day: 1.00    Years: 20.00    Pack years: 20.00    Types: Cigarettes  . Smokeless tobacco: Never Used  Vaping Use  . Vaping Use: Never used  Substance Use Topics  . Alcohol use: Yes    Comment: occasional  . Drug use: No    ROS   Objective:   Vitals: BP 111/72 (BP Location: Right Arm)   Pulse 85   Temp 98.3 F (36.8 C) (Oral)   Resp 18   LMP 04/12/2020   SpO2 99%   Physical Exam Constitutional:      General: She is in acute distress (From her dental pain).     Appearance: Normal appearance. She is well-developed. She is not ill-appearing, toxic-appearing or diaphoretic.  HENT:     Head: Normocephalic and atraumatic.     Right Ear: External ear normal.     Left Ear: External ear normal.  Nose: Nose normal.     Mouth/Throat:     Mouth: Mucous membranes are moist.     Dentition: Dental tenderness and dental caries present. No gingival swelling or dental abscesses.     Pharynx: Oropharynx is clear.  Eyes:     General: No scleral icterus.       Right eye: No discharge.        Left eye: No discharge.     Extraocular Movements: Extraocular movements intact.     Conjunctiva/sclera: Conjunctivae normal.     Pupils: Pupils are equal, round, and reactive to light.  Cardiovascular:     Rate and Rhythm: Normal rate.  Pulmonary:     Effort: Pulmonary effort is normal.   Skin:    General: Skin is warm and dry.  Neurological:     General: No focal deficit present.     Mental Status: She is alert and oriented to person, place, and time.     Cranial Nerves: No cranial nerve deficit.     Motor: No weakness.     Coordination: Coordination normal.     Gait: Gait normal.     Deep Tendon Reflexes: Reflexes normal.  Psychiatric:        Mood and Affect: Mood normal.        Behavior: Behavior normal.        Thought Content: Thought content normal.        Judgment: Judgment normal.     Assessment and Plan :   I have reviewed the PDMP during this encounter.  1. Pain, dental   2. Bad headache   3. History of migraine     Recommended using clindamycin since she just finished a course of amoxicillin.  Schedule naproxen, use hydrocodone for breakthrough pain.  Recommend that she keep her appointment with the dentist for dental extraction.  IM Toradol given in clinic for headache associated with her dental pain.  Use sumatriptan at home for typical migraines, follow-up with headache clinic. Counseled patient on potential for adverse effects with medications prescribed/recommended today, ER and return-to-clinic precautions discussed, patient verbalized understanding.    Wallis Bamberg, PA-C 05/10/20 1409

## 2020-05-10 NOTE — Discharge Instructions (Addendum)
Make sure you keep the appointment with a dentist/dental surgeon. Please schedule naproxen twice daily with food for your severe pain.  If you still have pain despite taking naproxen regularly, this is breakthrough pain.  You can use hydrocodone, a narcotic pain medicine, once every 4-6 hours for this.  Once your pain is better controlled, switch back to just naproxen.

## 2021-04-22 ENCOUNTER — Emergency Department (HOSPITAL_COMMUNITY)
Admission: EM | Admit: 2021-04-22 | Discharge: 2021-04-22 | Disposition: A | Discharge status: Home / Self Care | Payer: Self-pay | Attending: Emergency Medicine | Admitting: Emergency Medicine

## 2021-04-22 ENCOUNTER — Other Ambulatory Visit: Payer: Self-pay

## 2021-04-22 ENCOUNTER — Encounter (HOSPITAL_COMMUNITY): Payer: Self-pay

## 2021-04-22 DIAGNOSIS — Z5321 Procedure and treatment not carried out due to patient leaving prior to being seen by health care provider: Secondary | ICD-10-CM | POA: Insufficient documentation

## 2021-04-22 DIAGNOSIS — N939 Abnormal uterine and vaginal bleeding, unspecified: Secondary | ICD-10-CM | POA: Insufficient documentation

## 2021-04-22 DIAGNOSIS — F17210 Nicotine dependence, cigarettes, uncomplicated: Secondary | ICD-10-CM | POA: Insufficient documentation

## 2021-04-22 DIAGNOSIS — R110 Nausea: Secondary | ICD-10-CM | POA: Insufficient documentation

## 2021-04-22 DIAGNOSIS — N938 Other specified abnormal uterine and vaginal bleeding: Secondary | ICD-10-CM

## 2021-04-22 DIAGNOSIS — N9489 Other specified conditions associated with female genital organs and menstrual cycle: Secondary | ICD-10-CM | POA: Insufficient documentation

## 2021-04-22 HISTORY — DX: Other injury of unspecified body region, initial encounter: T14.8XXA

## 2021-04-22 LAB — CBC WITH DIFFERENTIAL/PLATELET
Abs Immature Granulocytes: 0.02 10*3/uL (ref 0.00–0.07)
Basophils Absolute: 0 10*3/uL (ref 0.0–0.1)
Basophils Relative: 0 %
Eosinophils Absolute: 0.1 10*3/uL (ref 0.0–0.5)
Eosinophils Relative: 1 %
HCT: 38.5 % (ref 36.0–46.0)
Hemoglobin: 13 g/dL (ref 12.0–15.0)
Immature Granulocytes: 0 %
Lymphocytes Relative: 31 %
Lymphs Abs: 2.8 10*3/uL (ref 0.7–4.0)
MCH: 31.5 pg (ref 26.0–34.0)
MCHC: 33.8 g/dL (ref 30.0–36.0)
MCV: 93.2 fL (ref 80.0–100.0)
Monocytes Absolute: 0.5 10*3/uL (ref 0.1–1.0)
Monocytes Relative: 6 %
Neutro Abs: 5.5 10*3/uL (ref 1.7–7.7)
Neutrophils Relative %: 62 %
Platelets: 346 10*3/uL (ref 150–400)
RBC: 4.13 MIL/uL (ref 3.87–5.11)
RDW: 12.2 % (ref 11.5–15.5)
WBC: 8.9 10*3/uL (ref 4.0–10.5)
nRBC: 0 % (ref 0.0–0.2)

## 2021-04-22 LAB — COMPREHENSIVE METABOLIC PANEL
ALT: 14 U/L (ref 0–44)
AST: 17 U/L (ref 15–41)
Albumin: 4.3 g/dL (ref 3.5–5.0)
Alkaline Phosphatase: 49 U/L (ref 38–126)
Anion gap: 7 (ref 5–15)
BUN: 10 mg/dL (ref 6–20)
CO2: 27 mmol/L (ref 22–32)
Calcium: 9.7 mg/dL (ref 8.9–10.3)
Chloride: 107 mmol/L (ref 98–111)
Creatinine, Ser: 0.75 mg/dL (ref 0.44–1.00)
GFR, Estimated: >60 mL/min (ref >60)
Glucose, Bld: 93 mg/dL (ref 70–99)
Potassium: 3.9 mmol/L (ref 3.5–5.1)
Sodium: 141 mmol/L (ref 135–145)
Total Bilirubin: 0.4 mg/dL (ref 0.3–1.2)
Total Protein: 7.6 g/dL (ref 6.5–8.1)

## 2021-04-22 LAB — I-STAT BETA HCG BLOOD, ED (MC, WL, AP ONLY): I-stat hCG, quantitative: <5 m[IU]/mL (ref <5)

## 2021-04-22 LAB — URINALYSIS, ROUTINE W REFLEX MICROSCOPIC
Bilirubin Urine: NEGATIVE
Glucose, UA: NEGATIVE mg/dL
Ketones, ur: NEGATIVE mg/dL
Leukocytes,Ua: NEGATIVE
Nitrite: NEGATIVE
Protein, ur: NEGATIVE mg/dL
Specific Gravity, Urine: 1.025 (ref 1.005–1.030)
pH: 6 (ref 5.0–8.0)

## 2021-04-22 LAB — WET PREP, GENITAL
Clue Cells Wet Prep HPF POC: NONE SEEN
Sperm: NONE SEEN
Trich, Wet Prep: NONE SEEN
WBC, Wet Prep HPF POC: NONE SEEN
Yeast Wet Prep HPF POC: NONE SEEN

## 2021-04-22 LAB — TYPE AND SCREEN
ABO/RH(D): A NEG
Antibody Screen: NEGATIVE

## 2021-04-22 MED ORDER — MEDROXYPROGESTERONE ACETATE 10 MG PO TABS: 5.0000 mg | ORAL_TABLET | Freq: Every day | ORAL | 0 refills | Status: DC

## 2021-04-22 NOTE — ED Triage Notes (Signed)
Patient c/o vaginal bleeding x 1 1/2 months.  Patient reports that she was having lower abdominal pain, but none at this time.  Patient states that her sexual partner was tested yesterday for STD's and the results were not back, but is being treated with Cipro and Doxycycline.

## 2021-04-22 NOTE — ED Provider Notes (Signed)
Emergency Medicine Provider Triage Evaluation Note  ASHLYNN GUNNELS , a 45 y.o. female  was evaluated in triage.  Pt complains of vaginal bleeding.  She states her symptoms started about 2 months ago.  She states she has had daily bleeding since her symptoms began.  She states she has changing tampons/pads about 3-4 times per day.  Reports lightheadedness as well as fatigue.  She states her boyfriend went to urgent care yesterday due to testicular pain and was told it was likely secondary to an STI and is currently being treated for an STI so she came to the emergency department today for further evaluation.  She states that she was initially experiencing abdominal pain for about 2 days but this is since resolved.  Physical Exam  There were no vitals taken for this visit. Gen:   Awake, no distress   Resp:  Normal effort  MSK:   Moves extremities without difficulty  Other:    Medical Decision Making  Medically screening exam initiated at 1:58 PM.  Appropriate orders placed.  [name] was informed that the remainder of the evaluation will be completed by another provider, this initial triage assessment does not replace that evaluation, and the importance of remaining in the ED until their evaluation is complete.   Placido Sou, PA-C 04/22/21 1359    Gerhard Munch, MD 04/22/21 1649
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
## 2021-04-22 NOTE — Discharge Instructions (Addendum)
You are seen in the emergency department for vaginal bleeding for 6 weeks and also concern for possible STD exposure.  Your blood counts were good.  A sample was sent for GC chlamydia and this will need to be followed up in MyChart.  We will also contact you if you need further treatment.  We are starting on some medication to help with your vaginal bleeding.  Please schedule an appointment with gynecology.  Return to the emergency department if any worsening or concerning symptoms

## 2021-04-22 NOTE — ED Notes (Signed)
Pelvic exam is set up.  

## 2021-04-23 LAB — GC/CHLAMYDIA PROBE AMP (~~LOC~~) NOT AT ARMC
Chlamydia: NEGATIVE
Comment: NEGATIVE
Comment: NORMAL
Neisseria Gonorrhea: NEGATIVE

## 2021-07-06 DIAGNOSIS — R52 Pain, unspecified: Secondary | ICD-10-CM | POA: Diagnosis not present

## 2021-07-06 DIAGNOSIS — U071 COVID-19: Secondary | ICD-10-CM | POA: Diagnosis not present

## 2021-07-06 DIAGNOSIS — R509 Fever, unspecified: Secondary | ICD-10-CM | POA: Diagnosis not present

## 2021-07-11 ENCOUNTER — Other Ambulatory Visit: Payer: Self-pay

## 2021-07-11 ENCOUNTER — Encounter (HOSPITAL_BASED_OUTPATIENT_CLINIC_OR_DEPARTMENT_OTHER): Payer: Self-pay | Admitting: *Deleted

## 2021-07-11 ENCOUNTER — Emergency Department (HOSPITAL_BASED_OUTPATIENT_CLINIC_OR_DEPARTMENT_OTHER)
Admission: EM | Admit: 2021-07-11 | Discharge: 2021-07-11 | Disposition: A | Discharge status: Home / Self Care | Payer: Medicaid Other | Attending: Emergency Medicine | Admitting: Emergency Medicine

## 2021-07-11 ENCOUNTER — Emergency Department (HOSPITAL_BASED_OUTPATIENT_CLINIC_OR_DEPARTMENT_OTHER): Payer: Medicaid Other

## 2021-07-11 DIAGNOSIS — U071 COVID-19: Secondary | ICD-10-CM | POA: Insufficient documentation

## 2021-07-11 DIAGNOSIS — F17210 Nicotine dependence, cigarettes, uncomplicated: Secondary | ICD-10-CM | POA: Insufficient documentation

## 2021-07-11 DIAGNOSIS — B349 Viral infection, unspecified: Secondary | ICD-10-CM | POA: Insufficient documentation

## 2021-07-11 DIAGNOSIS — Z28310 Unvaccinated for covid-19: Secondary | ICD-10-CM | POA: Insufficient documentation

## 2021-07-11 LAB — CBC WITH DIFFERENTIAL/PLATELET
Abs Immature Granulocytes: 0.02 10*3/uL (ref 0.00–0.07)
Basophils Absolute: 0 10*3/uL (ref 0.0–0.1)
Basophils Relative: 0 %
Eosinophils Absolute: 0 10*3/uL (ref 0.0–0.5)
Eosinophils Relative: 0 %
HCT: 39.5 % (ref 36.0–46.0)
Hemoglobin: 13.7 g/dL (ref 12.0–15.0)
Immature Granulocytes: 0 %
Lymphocytes Relative: 30 %
Lymphs Abs: 2.4 10*3/uL (ref 0.7–4.0)
MCH: 30.6 pg (ref 26.0–34.0)
MCHC: 34.7 g/dL (ref 30.0–36.0)
MCV: 88.2 fL (ref 80.0–100.0)
Monocytes Absolute: 0.5 10*3/uL (ref 0.1–1.0)
Monocytes Relative: 7 %
Neutro Abs: 5.1 10*3/uL (ref 1.7–7.7)
Neutrophils Relative %: 63 %
Platelets: 238 10*3/uL (ref 150–400)
RBC: 4.48 MIL/uL (ref 3.87–5.11)
RDW: 11.9 % (ref 11.5–15.5)
WBC: 8 10*3/uL (ref 4.0–10.5)
nRBC: 0 % (ref 0.0–0.2)

## 2021-07-11 LAB — BASIC METABOLIC PANEL
Anion gap: 11 (ref 5–15)
BUN: 7 mg/dL (ref 6–20)
CO2: 25 mmol/L (ref 22–32)
Calcium: 9.1 mg/dL (ref 8.9–10.3)
Chloride: 101 mmol/L (ref 98–111)
Creatinine, Ser: 0.79 mg/dL (ref 0.44–1.00)
GFR, Estimated: >60 mL/min (ref >60)
Glucose, Bld: 92 mg/dL (ref 70–99)
Potassium: 3.4 mmol/L — ABNORMAL LOW (ref 3.5–5.1)
Sodium: 137 mmol/L (ref 135–145)

## 2021-07-11 LAB — LIPASE, BLOOD: Lipase: 30 U/L (ref 11–51)

## 2021-07-11 MED ORDER — ONDANSETRON HCL 4 MG PO TABS: 4.0000 mg | ORAL_TABLET | Freq: Four times a day (QID) | ORAL | 0 refills | Status: DC

## 2021-07-11 MED ORDER — SODIUM CHLORIDE 0.9 % IV BOLUS
500.0000 mL | Freq: Once | INTRAVENOUS | Status: AC
  Administered 2021-07-11: 500 mL via INTRAVENOUS

## 2021-07-11 MED ORDER — ONDANSETRON HCL 4 MG/2ML IJ SOLN
4.0000 mg | Freq: Once | INTRAMUSCULAR | Status: AC
  Administered 2021-07-11: 4 mg via INTRAVENOUS
  Filled 2021-07-11: qty 2

## 2021-07-11 NOTE — Discharge Instructions (Signed)
Likely a viral infection, recommend over-the-counter pain medications like ibuprofen Tylenol for fever and pain control, nasal decongestions like Flonase and Zyrtec, Mucinex for cough.  If not eating recommend supplementing with Gatorade to help with electrolyte supplementation.  Follow-up PCP for further evaluation.  Come back to the emergency department if you develop chest pain, shortness of breath, severe abdominal pain, uncontrolled nausea, vomiting, diarrhea.  

## 2021-07-11 NOTE — ED Triage Notes (Signed)
She tested positive for Covid a week ago. Here today with dizziness. Oxygen sats 100%.

## 2021-07-11 NOTE — ED Notes (Signed)
Discharge instructions discussed with pt. Pt verbalized understanding with no questions at this time. Pt to go home with family. Updated pt pharmacy per pt request.
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
## 2021-09-09 ENCOUNTER — Emergency Department (HOSPITAL_BASED_OUTPATIENT_CLINIC_OR_DEPARTMENT_OTHER)
Admission: EM | Admit: 2021-09-09 | Discharge: 2021-09-09 | Disposition: A | Discharge status: Home / Self Care | Payer: Medicaid Other | Attending: Emergency Medicine | Admitting: Emergency Medicine

## 2021-09-09 ENCOUNTER — Other Ambulatory Visit: Payer: Self-pay

## 2021-09-09 ENCOUNTER — Encounter (HOSPITAL_BASED_OUTPATIENT_CLINIC_OR_DEPARTMENT_OTHER): Payer: Self-pay | Admitting: Urology

## 2021-09-09 ENCOUNTER — Emergency Department (HOSPITAL_BASED_OUTPATIENT_CLINIC_OR_DEPARTMENT_OTHER): Payer: Self-pay

## 2021-09-09 DIAGNOSIS — Z79899 Other long term (current) drug therapy: Secondary | ICD-10-CM | POA: Insufficient documentation

## 2021-09-09 DIAGNOSIS — R220 Localized swelling, mass and lump, head: Secondary | ICD-10-CM | POA: Insufficient documentation

## 2021-09-09 DIAGNOSIS — R55 Syncope and collapse: Secondary | ICD-10-CM | POA: Insufficient documentation

## 2021-09-09 LAB — BASIC METABOLIC PANEL
Anion gap: 7 (ref 5–15)
BUN: 11 mg/dL (ref 6–20)
CO2: 26 mmol/L (ref 22–32)
Calcium: 9 mg/dL (ref 8.9–10.3)
Chloride: 104 mmol/L (ref 98–111)
Creatinine, Ser: 0.7 mg/dL (ref 0.44–1.00)
GFR, Estimated: >60 mL/min (ref >60)
Glucose, Bld: 108 mg/dL — ABNORMAL HIGH (ref 70–99)
Potassium: 3.7 mmol/L (ref 3.5–5.1)
Sodium: 137 mmol/L (ref 135–145)

## 2021-09-09 LAB — CBC
HCT: 32.7 % — ABNORMAL LOW (ref 36.0–46.0)
Hemoglobin: 11.4 g/dL — ABNORMAL LOW (ref 12.0–15.0)
MCH: 31.8 pg (ref 26.0–34.0)
MCHC: 34.9 g/dL (ref 30.0–36.0)
MCV: 91.3 fL (ref 80.0–100.0)
Platelets: 286 10*3/uL (ref 150–400)
RBC: 3.58 MIL/uL — ABNORMAL LOW (ref 3.87–5.11)
RDW: 12.9 % (ref 11.5–15.5)
WBC: 7.3 10*3/uL (ref 4.0–10.5)
nRBC: 0 % (ref 0.0–0.2)

## 2021-09-09 NOTE — ED Triage Notes (Signed)
Knot on right occipital scalp x 3 months  Denies any injury Denies any drainage from area  H/o pseudoseizures but denies hitting head prior to knot occurring NAD now A&o x 4
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
## 2021-09-09 NOTE — Discharge Instructions (Addendum)
Your work-up in the emergency room was reassuring.  No concerning sign of volume passed out.  Given you have history of pseudoseizure and migraine I have attached a referral to neurology for you above.  Especially given that your previous episode of pseudoseizure was similar to this episode 2 days ago.  Please give them a call tomorrow to schedule your appointment.  You can also follow-up with your primary care provider for the month behind her head.  This is not an abscess or anything that needs to be addressed in the emergency today.  If you have any worsening of your symptoms, chest pain, palpitations, recurrent passing out episodes please return to the emergency room for evaluation.

## 2021-09-09 NOTE — ED Notes (Signed)
ED Provider at bedside. 

## 2021-09-09 NOTE — ED Notes (Signed)
Patient transported to X-ray 

## 2021-09-18 ENCOUNTER — Encounter: Payer: Self-pay | Admitting: Neurology

## 2021-09-26 ENCOUNTER — Other Ambulatory Visit: Payer: Self-pay

## 2021-09-26 ENCOUNTER — Encounter: Payer: Self-pay | Admitting: Neurology

## 2021-09-26 ENCOUNTER — Ambulatory Visit (INDEPENDENT_AMBULATORY_CARE_PROVIDER_SITE_OTHER): Payer: Self-pay | Admitting: Neurology

## 2021-09-26 ENCOUNTER — Ambulatory Visit: Payer: Medicaid Other | Admitting: Neurology

## 2021-09-26 VITALS — BP 140/78 | HR 97 | Ht 62.0 in | Wt 132.2 lb

## 2021-09-26 DIAGNOSIS — G43001 Migraine without aura, not intractable, with status migrainosus: Secondary | ICD-10-CM

## 2021-09-26 DIAGNOSIS — R404 Transient alteration of awareness: Secondary | ICD-10-CM

## 2021-09-26 MED ORDER — TOPIRAMATE 50 MG PO TABS: ORAL_TABLET | ORAL | 6 refills | Status: AC

## 2021-09-26 MED ORDER — TOPIRAMATE 50 MG PO TABS: ORAL_TABLET | ORAL | 6 refills | Status: DC
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
## 2021-09-26 NOTE — Progress Notes (Signed)
Pt stated she has headaches every day. Had a knot on back of her had that was size of a dime that popped up and was there for a while the disappeared causes really bad headaches

## 2021-10-07 ENCOUNTER — Other Ambulatory Visit: Payer: Self-pay

## 2021-10-07 ENCOUNTER — Ambulatory Visit (INDEPENDENT_AMBULATORY_CARE_PROVIDER_SITE_OTHER): Payer: Self-pay | Admitting: Neurology

## 2021-10-07 DIAGNOSIS — R404 Transient alteration of awareness: Secondary | ICD-10-CM

## 2021-10-07 DIAGNOSIS — G43001 Migraine without aura, not intractable, with status migrainosus: Secondary | ICD-10-CM

## 2021-10-10 NOTE — Procedures (Signed)
ELECTROENCEPHALOGRAM REPORT  Date of Study: 10/07/2021  Patient's Name: Katie Malone MRN: [id number] Date of Birth: [birth year]  Referring Provider: Dr. Patrcia Dolly  Clinical History: This is a 46 year old woman with an episode of loss of awareness. EEG for classification.  Medications: Tylenol  Technical Summary: A multichannel digital 1-hour EEG recording measured by the international 10-20 system with electrodes applied with paste and impedances below 5000 ohms performed in our laboratory with EKG monitoring in an awake and asleep patient.  Hyperventilation was not performed. Photic stimulation was performed.  The digital EEG was referentially recorded, reformatted, and digitally filtered in a variety of bipolar and referential montages for optimal display.    Description: The patient is awake and asleep during the recording.  During maximal wakefulness, there is a symmetric, medium voltage 10 Hz posterior dominant rhythm that attenuates with eye opening.  The record is symmetric.  During drowsiness and sleep, there is an increase in theta slowing of the background.  Vertex waves and symmetric sleep spindles were seen.  Photic stimulation did not elicit any abnormalities.  There were no epileptiform discharges or electrographic seizures seen.    EKG lead was unremarkable.  Impression: This 1-hour awake and asleep EEG is normal.    Clinical Correlation: A normal EEG does not exclude a clinical diagnosis of epilepsy.  If further clinical questions remain, prolonged EEG may be helpful.  Clinical correlation is advised.   Patrcia Dolly, M.D.

## 2021-10-22 ENCOUNTER — Other Ambulatory Visit: Payer: Medicaid Other

## 2021-11-05 ENCOUNTER — Ambulatory Visit
Admission: RE | Admit: 2021-11-05 | Discharge: 2021-11-05 | Disposition: A | Discharge status: Home / Self Care | Payer: Medicaid Other | Source: Ambulatory Visit | Attending: Neurology | Admitting: Neurology

## 2021-11-05 ENCOUNTER — Other Ambulatory Visit: Payer: Self-pay

## 2021-11-05 MED ORDER — GADOBENATE DIMEGLUMINE 529 MG/ML IV SOLN
12.0000 mL | Freq: Once | INTRAVENOUS | Status: AC | PRN
  Administered 2021-11-05: 12 mL via INTRAVENOUS

## 2021-11-06 ENCOUNTER — Other Ambulatory Visit: Payer: Self-pay

## 2021-11-06 ENCOUNTER — Emergency Department (HOSPITAL_BASED_OUTPATIENT_CLINIC_OR_DEPARTMENT_OTHER)
Admission: EM | Admit: 2021-11-06 | Discharge: 2021-11-06 | Disposition: A | Discharge status: Home / Self Care | Payer: Medicaid Other | Attending: Emergency Medicine | Admitting: Emergency Medicine

## 2021-11-06 ENCOUNTER — Encounter (HOSPITAL_BASED_OUTPATIENT_CLINIC_OR_DEPARTMENT_OTHER): Payer: Self-pay

## 2021-11-06 ENCOUNTER — Emergency Department (HOSPITAL_COMMUNITY)
Admission: EM | Admit: 2021-11-06 | Discharge: 2021-11-06 | Discharge status: Against Medical Advice | Payer: Medicaid Other | Attending: Emergency Medicine | Admitting: Emergency Medicine

## 2021-11-06 DIAGNOSIS — R55 Syncope and collapse: Secondary | ICD-10-CM | POA: Insufficient documentation

## 2021-11-06 DIAGNOSIS — R42 Dizziness and giddiness: Secondary | ICD-10-CM | POA: Insufficient documentation

## 2021-11-06 DIAGNOSIS — R110 Nausea: Secondary | ICD-10-CM | POA: Insufficient documentation

## 2021-11-06 DIAGNOSIS — R531 Weakness: Secondary | ICD-10-CM | POA: Insufficient documentation

## 2021-11-06 DIAGNOSIS — Z5329 Procedure and treatment not carried out because of patient's decision for other reasons: Secondary | ICD-10-CM | POA: Insufficient documentation

## 2021-11-06 DIAGNOSIS — I959 Hypotension, unspecified: Secondary | ICD-10-CM | POA: Diagnosis not present

## 2021-11-06 DIAGNOSIS — R569 Unspecified convulsions: Secondary | ICD-10-CM | POA: Diagnosis not present

## 2021-11-06 LAB — URINALYSIS, ROUTINE W REFLEX MICROSCOPIC
Bilirubin Urine: NEGATIVE
Glucose, UA: NEGATIVE mg/dL
Hgb urine dipstick: NEGATIVE
Ketones, ur: NEGATIVE mg/dL
Leukocytes,Ua: NEGATIVE
Nitrite: NEGATIVE
Protein, ur: NEGATIVE mg/dL
Specific Gravity, Urine: 1.009 (ref 1.005–1.030)
pH: 7.5 (ref 5.0–8.0)

## 2021-11-06 LAB — TROPONIN I (HIGH SENSITIVITY): Troponin I (High Sensitivity): 2 ng/L (ref <18)

## 2021-11-06 LAB — BASIC METABOLIC PANEL
Anion gap: 9 (ref 5–15)
BUN: 9 mg/dL (ref 6–20)
CO2: 24 mmol/L (ref 22–32)
Calcium: 9.3 mg/dL (ref 8.9–10.3)
Chloride: 109 mmol/L (ref 98–111)
Creatinine, Ser: 0.81 mg/dL (ref 0.44–1.00)
GFR, Estimated: >60 mL/min (ref >60)
Glucose, Bld: 101 mg/dL — ABNORMAL HIGH (ref 70–99)
Potassium: 3.7 mmol/L (ref 3.5–5.1)
Sodium: 142 mmol/L (ref 135–145)

## 2021-11-06 LAB — CBC
HCT: 36.7 % (ref 36.0–46.0)
Hemoglobin: 12.4 g/dL (ref 12.0–15.0)
MCH: 30.9 pg (ref 26.0–34.0)
MCHC: 33.8 g/dL (ref 30.0–36.0)
MCV: 91.5 fL (ref 80.0–100.0)
Platelets: 289 10*3/uL (ref 150–400)
RBC: 4.01 MIL/uL (ref 3.87–5.11)
RDW: 12.1 % (ref 11.5–15.5)
WBC: 7.6 10*3/uL (ref 4.0–10.5)
nRBC: 0 % (ref 0.0–0.2)

## 2021-11-06 LAB — CBG MONITORING, ED: Glucose-Capillary: 107 mg/dL — ABNORMAL HIGH (ref 70–99)

## 2021-11-06 LAB — HCG, SERUM, QUALITATIVE: Preg, Serum: NEGATIVE

## 2021-11-06 MED ORDER — SODIUM CHLORIDE 0.9 % IV BOLUS
1000.0000 mL | Freq: Once | INTRAVENOUS | Status: AC
  Administered 2021-11-06: 1000 mL via INTRAVENOUS

## 2021-11-06 NOTE — ED Notes (Signed)
Pt state that she is going to go to a different ED because this one is taking to long to complete her care.  ? ?IV removed and pt ambulatory out of department. ? ?Pt states that she understands that the provider would like to see her and that leaving now without discharge paperwork she will be leaving AMA ?

## 2021-11-06 NOTE — ED Provider Notes (Signed)
?  [facility] EMERGENCY DEPARTMENT ?Provider Note ? ? ?CSN: [id number] ?Arrival date & time: 11/06/21  1134 ? ?  ? ?History ? ?Chief Complaint  ?Patient presents with  ? Near Syncope  ? ? ?Katie Malone is a 46 y.o. female. ? ?At presenting with a complaint of near syncope.  Patient arrived by EMS.  Patient has an MRI done yesterday and since then patient has been having nausea and general weakness.  Patient states she was feeling very dizzy at work she poured water on herself and work to prevent faint fainting.  EMS gave her Zofran and she got of fluid bolus with some relief.  Patient has a history of seizures she is followed by [facility] neurology.  Patient states she had 2 seizures yesterday but no seizures today. ? ?Patient ended up leaving before I could formally see her ? ? ?  ? ?Home Medications ?Prior to Admission medications   ?Medication Sig Start Date End Date Taking? Authorizing Provider  ?acetaminophen (TYLENOL) 500 MG tablet Take 1,000-1,500 mg by mouth every 6 (six) hours as needed for headache.     [provider]  ?topiramate (TOPAMAX) 50 MG tablet Take 1/2 tablet twice a day for 1 week, then increase to 1 tablet twice a day 09/26/21   Van Clines, MD  ?   ? ?Allergies    ?Shrimp [shellfish allergy]   ? ?Review of Systems   ?Review of Systems ? ?Physical Exam ?Updated Vital Signs ?BP 109/72 (BP Location: Right Arm)   Pulse (!) 56   Temp 98.2 ?F (36.8 ?C) (Oral)   Resp 20   SpO2 99%  ?Physical Exam ? ?ED Results / Procedures / Treatments   ?Labs ?(all labs ordered are listed, but only abnormal results are displayed) ?Labs Reviewed - No data to display ? ?EKG ?None ? ?Radiology ?No results found. ? ?Procedures ?Procedures  ? ? ?Medications Ordered in ED ?Medications - No data to display ? ?ED Course/ Medical Decision Making/ A&P ?  ?                        ?Medical Decision Making ? ? ?Final Clinical Impression(s) / ED Diagnoses ?Final diagnoses:  ?None  ? ? ?Rx / DC  Orders ?ED Discharge Orders   ? ? None  ? ?  ? ? ?  ?Vanetta Mulders, MD ?11/06/21 1323 ? ?

## 2021-11-06 NOTE — ED Triage Notes (Signed)
Patient here POV from Home with Syncopal Episode. ? ?Patient was at Work at approximately 1100 standing up when she became lightheaded and had syncopal episode. Patient was caught and did not fall. ? ?History of Seizures and Migraines. Patient had MRI completed yesterday and when she had the IV Contrast she became Nauseous and Lightheaded since.  ? ?Patient was at another ED but LWBS due to Wait Time. ? ?NAD Noted during Triage. A&Ox4. GCS 15. Ambulatory.  ? ?

## 2021-11-06 NOTE — Discharge Instructions (Addendum)
You came to the emergency department today to be evaluated for your episode of passing out.  Your lab results and physical exam were reassuring.  Due to you having multiple syncopal episodes we discussed obtaining an ultrasound of your heart also known as a echocardiogram.  You elected to have this done in outpatient setting.  I placed a referral to cardiology, they should call you in 2-3 business days to schedule this appointment.  If you do not hear from them please call to schedule a follow-up appointment. ? ?Due to reports of seizures please follow-up with your neurologist for further management.  Please continue to take your Topamax medication as prescribed. ? ?Get help right away if: ?You faint. ?You hit your head or are injured after fainting. ?You have any of these symptoms that may indicate trouble with your heart: ?Fast or irregular heartbeats (palpitations). ?Unusual pain in your chest, abdomen, or back. ?Shortness of breath. ?You have a seizure. ?You have a severe headache. ?You are confused. ?You have vision problems. ?You have severe weakness or trouble walking. ?You are bleeding from your mouth or rectum, or you have black or tarry stool. ?

## 2021-11-06 NOTE — ED Notes (Signed)
Pt unable to void at present time, water given and fluids started ?

## 2021-11-06 NOTE — ED Triage Notes (Signed)
Pt arrived by EMS concerned for near syncopal event. Pt has MRI done yesterday and since then pt has been having nausea and general weakness. Pt states she was feeling very dizzy at work, she poured water on herself and work to prevent fainting. ?EMS gave zofran with some relief and bolus  ?Hx of seizures, states that over the past month she has been having an increase. Pt states that she had 2 seizures yesterday, none today.  ?

## 2021-11-06 NOTE — ED Provider Notes (Signed)
?[facility] EMERGENCY DEPT ?Provider Note ? ? ?CSN: [id number] ?Arrival date & time: 11/06/21  1712 ? ?  ? ?History ? ?Chief Complaint  ?Patient presents with  ? Loss of Consciousness  ? ? ?Katie Malone is a 46 y.o. female with past medical history of migraine without aura, transient alteration of awareness.  Presents to the emergency department with a chief complaint of syncopal episode.  Patient states that today at approximately 11 AM while at work she had a syncopal episode.  Patient states that she was standing when she began to feel lightheaded.  Her coworker picked her up and was moving her to the break room when she lost consciousness.  Patient is unsure how long she was unconscious for but believes it was just a few minutes.  Patient states that she had no preceding chest pain, shortness of breath, sudden onset of headache, palpitations.  Patient reports that she continue to feel lightheaded after her syncopal episode.  This resolved after she received 1 L fluid bolus with EMS in route to [facility] ED. patient reports that she left prior to being seen by provider.  States that he sees beginning to feel lightheaded again. ? ?Patient reports that she has a history of seizures.  Reports multiple seizures over the last few days.  Last seizure occurred yesterday.  Seizure was unwitnessed.  Patient nausea, seizure lasted for.  Patient reports that she has been taking her Topamax medication slightly different than prescribed.  Patient is taking 2 tablets at night instead of 1 tablet twice daily ? ?Patient denies any illicit drug use.  Endorses occasional alcohol use.  Endorses nicotine use.  No recent falls or traumatic injuries.  Patient reports that last menstrual period ended yesterday.  Patient does endorse heavy menstrual bleeding. ? ?Denies any fever, chills, shortness of breath, chest pain, palpitations, leg swelling or tenderness, abdominal pain, nausea, vomiting, diarrhea, dysuria,  hematuria, urinary urgency, vaginal pain, vaginal bleeding, vaginal discharge, neck pain, back pain, numbness, weakness, facial asymmetry, dysarthria, visual disturbance. ? ? ?Loss of Consciousness ?Associated symptoms: seizures   ?Associated symptoms: no chest pain, no confusion, no dizziness, no fever, no headaches, no nausea, no shortness of breath, no vomiting and no weakness   ? ?  ? ?Home Medications ?Prior to Admission medications   ?Medication Sig Start Date End Date Taking? Authorizing Provider  ?acetaminophen (TYLENOL) 500 MG tablet Take 1,000-1,500 mg by mouth every 6 (six) hours as needed for headache.     [provider]  ?topiramate (TOPAMAX) 50 MG tablet Take 1/2 tablet twice a day for 1 week, then increase to 1 tablet twice a day 09/26/21   Van Clines, MD  ?   ? ?Allergies    ?Shrimp [shellfish allergy]   ? ?Review of Systems   ?Review of Systems  ?Constitutional:  Negative for chills and fever.  ?Eyes:  Negative for visual disturbance.  ?Respiratory:  Negative for shortness of breath.   ?Cardiovascular:  Positive for syncope. Negative for chest pain.  ?Gastrointestinal:  Negative for abdominal pain, diarrhea, nausea and vomiting.  ?Genitourinary:  Negative for difficulty urinating, dysuria, flank pain, frequency, pelvic pain, urgency, vaginal bleeding, vaginal discharge and vaginal pain.  ?Musculoskeletal:  Negative for back pain and neck pain.  ?Skin:  Negative for color change and rash.  ?Neurological:  Positive for seizures, syncope and light-headedness. Negative for dizziness, tremors, facial asymmetry, speech difficulty, weakness, numbness and headaches.  ?Psychiatric/Behavioral:  Negative for confusion.   ? ?  Physical Exam ?Updated Vital Signs ?BP 96/66   Pulse 73   Temp 98 ?F (36.7 ?C)   Resp 14   Ht 5\' 2"  (1.575 m)   Wt 60 kg   SpO2 98%   BMI 24.19 kg/m?  ?Physical Exam ?Vitals and nursing note reviewed.  ?Constitutional:   ?   General: She is not in acute distress. ?    Appearance: She is not ill-appearing, toxic-appearing or diaphoretic.  ?HENT:  ?   Head: Normocephalic and atraumatic. No raccoon eyes, Battle's sign, abrasion, contusion, masses, right periorbital erythema, left periorbital erythema or laceration.  ?   Jaw: No trismus or pain on movement.  ?Eyes:  ?   General: No scleral icterus.    ?   Right eye: No discharge.     ?   Left eye: No discharge.  ?Cardiovascular:  ?   Rate and Rhythm: Normal rate.  ?   Pulses:     ?     Radial pulses are 2+ on the right side and 2+ on the left side.  ?   Heart sounds: Normal heart sounds, S1 normal and S2 normal. No murmur heard. ?Pulmonary:  ?   Effort: Pulmonary effort is normal.  ?Abdominal:  ?   General: Abdomen is flat. There is no distension. There are no signs of injury.  ?   Palpations: Abdomen is soft. There is no mass or pulsatile mass.  ?   Tenderness: There is no abdominal tenderness. There is no guarding or rebound.  ?   Hernia: There is no hernia in the umbilical area or ventral area.  ?Musculoskeletal:  ?   Right lower leg: No edema.  ?   Left lower leg: No edema.  ?Skin: ?   General: Skin is warm and dry.  ?Neurological:  ?   General: No focal deficit present.  ?   Mental Status: She is alert and oriented to person, place, and time.  ?   GCS: GCS eye subscore is 4. GCS verbal subscore is 5. GCS motor subscore is 6.  ?   Cranial Nerves: Cranial nerves 2-12 are intact. No cranial nerve deficit, dysarthria or facial asymmetry.  ?   Motor: No weakness, tremor, seizure activity or pronator drift.  ?   Coordination: Finger-Nose-Finger Test normal.  ?   Comments: +5 strength to bilateral upper and lower extremities.  Grip strength equal.  ?Psychiatric:     ?   Behavior: Behavior is cooperative.  ? ? ?ED Results / Procedures / Treatments   ?Labs ?(all labs ordered are listed, but only abnormal results are displayed) ?Labs Reviewed  ?BASIC METABOLIC PANEL - Abnormal; Notable for the following components:  ?    Result Value  ?  Glucose, Bld 101 (*)   ? All other components within normal limits  ?URINALYSIS, ROUTINE W REFLEX MICROSCOPIC - Abnormal; Notable for the following components:  ? Color, Urine COLORLESS (*)   ? All other components within normal limits  ?CBG MONITORING, ED - Abnormal; Notable for the following components:  ? Glucose-Capillary 107 (*)   ? All other components within normal limits  ?CBC  ?HCG, SERUM, QUALITATIVE  ?TOPIRAMATE LEVEL  ?TROPONIN I (HIGH SENSITIVITY)  ?TROPONIN I (HIGH SENSITIVITY)  ? ? ?EKG ?EKG Interpretation ? ?Date/Time:  Thursday November 06 2021 17:28:39 EDT ?Ventricular Rate:  77 ?PR Interval:  166 ?QRS Duration: 86 ?QT Interval:  396 ?QTC Calculation: 448 ?R Axis:   21 ?Text Interpretation: Sinus rhythm with  marked sinus arrhythmia Otherwise normal ECG When compared with ECG of 06-Nov-2021 11:37, PREVIOUS ECG IS PRESENT Confirmed by Alvester Chou [phone]) on 11/06/2021 6:24:05 PM ? ?Radiology ?No results found. ? ?Procedures ?Procedures  ? ? ?Medications Ordered in ED ?Medications  ?sodium chloride 0.9 % bolus 1,000 mL (1,000 mLs Intravenous New Bag/Given 11/06/21 1800)  ? ? ?ED Course/ Medical Decision Making/ A&P ?  ?                        ?Medical Decision Making ?Amount and/or Complexity of Data Reviewed ?Labs: ordered. ? ? ?Alert 46 year old female in no acute distress, nontoxic-appearing.  Presents emergency department with a chief complaint of syncopal episode. ? ?Information obtained from patient.  Past medical records were reviewed including previous bladder notes, labs, and imaging.  Patient has past medical history as outlined in HPI which complicates her care. ? ?Due to reports of syncopal episode will obtain EKG, BMP, CBC, troponin, pregnancy test, urinalysis, topiramate level.  Patient noted to have hypotension with systolic blood pressure in the 90s we will give patient 1 L fluid bolus. ? ?I personally viewed and interpreted patient's EKG.  Tracing shows sinus rhythm with sinus  arrhythmia. ? ?I personally viewed and interpreted patient's lab results.  Pertinent findings include: ?-Troponin 2 ?-Pregnancy test negative ?-Urinalysis shows no signs of infection ?-CBC and BMP unremarkable ? ?Patient repo

## 2021-11-10 LAB — TOPIRAMATE LEVEL: Topiramate Lvl: <1.5 ug/mL — ABNORMAL LOW (ref 2.0–25.0)

## 2021-11-11 ENCOUNTER — Telehealth: Payer: Self-pay

## 2021-11-11 NOTE — Telephone Encounter (Signed)
-----   Message from Van Clines, MD sent at 11/10/2021  2:21 PM EDT ----- ?Pls let her know brain MRI is normal, no tumor, stroke, or bleed. Thanks ?

## 2021-11-11 NOTE — Telephone Encounter (Signed)
Pt called an informed MRI is normal, no tumor, stroke, or bleed. Pt wanted Dr Karel Jarvis to know that the other day she passed out at work went to the hospital they sent her a referral to cardiology to have an ultrasound done,  ?

## 2021-11-11 NOTE — Telephone Encounter (Signed)
Is she having any side effects on the Topamax? If no side effects, I'd like to increase to 100mg : take 1 tablet twice a day. If she is okay with this, pls send in Rx. No driving after passing out, until 6 months event-free. Thanks ?

## 2021-11-11 NOTE — Telephone Encounter (Signed)
Pt called no answer left a voice mail to call the office back  °

## 2021-11-13 NOTE — Telephone Encounter (Signed)
Pt called no answer left a voice mail to call office back  °

## 2021-11-14 NOTE — Telephone Encounter (Signed)
Pt called no answer left a voice mail to call office back, my chart message sent to pt. ?

## 2021-11-17 ENCOUNTER — Encounter: Payer: Self-pay | Admitting: Internal Medicine

## 2021-11-17 ENCOUNTER — Ambulatory Visit (INDEPENDENT_AMBULATORY_CARE_PROVIDER_SITE_OTHER): Payer: Self-pay | Admitting: Internal Medicine

## 2021-11-17 VITALS — BP 115/70 | HR 75 | Ht 62.0 in | Wt 137.4 lb

## 2021-11-17 DIAGNOSIS — Z87898 Personal history of other specified conditions: Secondary | ICD-10-CM

## 2021-11-17 DIAGNOSIS — R55 Syncope and collapse: Secondary | ICD-10-CM

## 2021-11-17 NOTE — Patient Instructions (Signed)
Medication Instructions:  ?Your physician recommends that you continue on your current medications as directed. Please refer to the Current Medication list given to you today. ? ? ?*If you need a refill on your cardiac medications before your next appointment, please call your pharmacy* ? ? ? ?Testing/Procedures: ?Your physician has requested that you have an echocardiogram. Echocardiography is a painless test that uses sound waves to create images of your heart. It provides your doctor with information about the size and shape of your heart and how well your heart?s chambers and valves are working. This procedure takes approximately one hour. There are no restrictions for this procedure. ?-- done at [facility] -- [address] - [address] [employer] ? ? ?Follow-Up: ?At [facility], you and your health needs are our priority.  As part of our continuing mission to provide you with exceptional heart care, we have created designated Provider Care Teams.  These Care Teams include your primary Cardiologist (physician) and Advanced Practice Providers (APPs -  Physician Assistants and Nurse Practitioners) who all work together to provide you with the care you need, when you need it. ? ?We recommend signing up for the patient portal called "MyChart".  Sign up information is provided on this After Visit Summary.  MyChart is used to connect with patients for Virtual Visits (Telemedicine).  Patients are able to view lab/test results, encounter notes, upcoming appointments, etc.  Non-urgent messages can be sent to your provider as well.   ?To learn more about what you can do with MyChart, go to [email or web address].   ? ?Your next appointment:   ?AS NEEDED with Dr. Rennis Golden  ? ?
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
## 2021-11-26 ENCOUNTER — Telehealth: Payer: Self-pay | Admitting: Licensed Clinical Social Worker

## 2021-11-26 NOTE — Telephone Encounter (Signed)
Noted pt uninsured during most recent visit to [facility].  ?Attempted to reach pt this morning to discuss assistance application options. No answer at [phone]- voicemail left requesting call back. I will re-attempt as able.  ? ?Westley Hummer, MSW, LCSW ?Clinical Social Worker II ?[facility]  ?[phone]- work cell phone (preferred) ?[phone]- desk phone ? ?

## 2021-11-28 ENCOUNTER — Encounter (HOSPITAL_BASED_OUTPATIENT_CLINIC_OR_DEPARTMENT_OTHER): Payer: Self-pay | Admitting: *Deleted

## 2021-11-28 ENCOUNTER — Ambulatory Visit (HOSPITAL_BASED_OUTPATIENT_CLINIC_OR_DEPARTMENT_OTHER): Payer: Self-pay

## 2021-11-28 ENCOUNTER — Telehealth: Payer: Self-pay | Admitting: Licensed Clinical Social Worker

## 2021-11-28 DIAGNOSIS — R55 Syncope and collapse: Secondary | ICD-10-CM

## 2021-11-28 LAB — ECHOCARDIOGRAM COMPLETE
AR max vel: 3 cm2
AV Area VTI: 2.76 cm2
AV Area mean vel: 2.64 cm2
AV Mean grad: 2 mmHg
AV Peak grad: 4.8 mmHg
Ao pk vel: 1.09 m/s
Area-P 1/2: 4.8 cm2
Calc EF: 67 %
S' Lateral: 2.68 cm
Single Plane A2C EF: 70.4 %
Single Plane A4C EF: 58.9 %

## 2021-11-28 NOTE — Telephone Encounter (Signed)
LCSW attempted again to reach pt this morning at [phone]. No answer, voicemail left requesting call back.  ? ?Octavio Graves, MSW, LCSW ?Clinical Social Worker II ?[facility]  ?[phone]- work cell phone (preferred) ?[phone]- desk phone ? ?

## 2021-12-03 ENCOUNTER — Telehealth: Payer: Self-pay | Admitting: Licensed Clinical Social Worker

## 2021-12-03 NOTE — Progress Notes (Signed)
?Heart and Vascular Care Navigation ? ?12/03/2021 ? ?Katie Malone ?03-07-1976 ?[id number] ? ?Reason for Referral:  ?No insurance, f/u on access.  ?Engaged with patient by telephone for initial visit for Heart and Vascular Care Coordination. ?                                                                                                  ?Assessment:     ?LCSW called and was able to reach pt today at [phone]. Introduced self, role, reason for call. Pt was at work, assessment was brief. Pt confirmed home address, states she lives there with her children- family members are okay to have as emergency contacts but she has signed DPR for herself only. Pt confirms she is currently uninsured but states she will be receiving insurance through her job- is nearing the 90 day mark and should have insurance and FSA moving forward. She does go to the [facility] for PCP. Pt interested in CAFA to help with some of the bills which she acquired when she was uninsured. Pt also interested in assistance with SNAP, due to her new income she is no longer eligible. Pt understands if anything else arises/there is concern of having utilities turned off or if she has questions about how to complete paperwork to give me a call. No additional needs noted at this time.                                ? ?HRT/VAS Care Coordination   ? ? Patients Home Cardiology Office [facility]  ? Outpatient Care Team Social Worker  ? Social Worker Name: Timmothy Euler [phone]  ? Living arrangements for the past 2 months Single Family Home  ? Lives with: Minor Children  ? Patient Current Insurance Coverage Self-Pay  ? Patient Has Concern With Paying Medical Bills Yes  ? Patient Concerns With Medical Bills past medical bills;  will have coverage starting soon through job  ? Medical Bill Referrals: CAFA  ? Does Patient Have Prescription Coverage? No  ? Patient Prescription Assistance Programs Other   ? Other Assistance Programs Medications topamax  ? Home Assistive Devices/Equipment None  ? ?  ? ? ?Social History:                                                                             ?SDOH Screenings  ? ?Alcohol Screen: Not on file  ?Depression (PHQ2-9): Not on file  ?Financial Resource Strain: Medium Risk  ? Difficulty of Paying Living Expenses: Somewhat hard  ?Food Insecurity: Food Insecurity Present  ? Worried About Programme researcher, broadcasting/film/video in the Last Year: Sometimes true  ? Ran Out of Food in the Last  Year: Sometimes true  ?Housing: Low Risk   ? Last Housing Risk Score: 0  ?Physical Activity: Not on file  ?Social Connections: Not on file  ?Stress: Not on file  ?Tobacco Use: High Risk  ? Smoking Tobacco Use: Every Day  ? Smokeless Tobacco Use: Never  ? Passive Exposure: Not on file  ?Transportation Needs: No Transportation Needs  ? Lack of Transportation (Medical): No  ? Lack of Transportation (Non-Medical): No  ? ? ?SDOH Interventions: ?Financial Resources:  Financial Strain Interventions: Other (Comment) (mailed CAFA for previous bills; medications costly at [facility], cheaper at [facility]; food banks information) ?Financial Counseling for [employer] Program  ?Food Insecurity:  Food Insecurity Interventions: Other (Comment) (referral to [employer], [employer], [employer], and [employer])  ?Housing Insecurity:  Housing Interventions: Intervention Not Indicated  ?Transportation:   Transportation Interventions: Intervention Not Indicated  ? ? ?Other Care Navigation Interventions:    ? ?Provided Pharmacy assistance resources Other- [employer] and [facility]; encouraged to ask neurology about sample  ? ?Follow-up plan:   ?LCSW has mailed pt several food bank options including a letter for [employer], I mailed CAFA along with instructions, information about discounted medication cost at [facility] and my card for further questions. I will f/u with pt in the  next few weeks unless she calls me before that time. I remain available.  ? ? ? ?

## 2021-12-16 ENCOUNTER — Telehealth: Payer: Self-pay | Admitting: Licensed Clinical Social Worker

## 2021-12-16 NOTE — Telephone Encounter (Signed)
LCSW attempted to reach pt at [phone] regarding assistance applications mailed to pt. No answer, voicemail left requesting call back as able. ? ?I will reattempt again this week as able.  ? ?Octavio Graves, MSW, LCSW ?Clinical Social Worker II ?[place] Heart/Vascular Care Navigation  ?[phone]- work cell phone (preferred) ?[phone]- desk phone ? ?

## 2021-12-18 ENCOUNTER — Telehealth: Payer: Self-pay | Admitting: Licensed Clinical Social Worker

## 2021-12-18 NOTE — Telephone Encounter (Signed)
Attempted to reach pt via text message today as no call back from pt earlier this week. I have mailed assistance applications and additional resources. Should pt have any questions/concerns was encouraged to reach out to me. Will re-attempt again as able.  ? ?Westley Hummer, MSW, LCSW ?Clinical Social Worker II ?[place] Heart/Vascular Care Navigation  ?[phone]- work cell phone (preferred) ?[phone]- desk phone ? ?

## 2021-12-23 ENCOUNTER — Ambulatory Visit: Payer: Self-pay | Admitting: Neurology

## 2021-12-24 ENCOUNTER — Ambulatory Visit: Payer: Medicaid Other | Admitting: Physician Assistant

## 2021-12-31 ENCOUNTER — Telehealth: Payer: Self-pay | Admitting: Licensed Clinical Social Worker

## 2021-12-31 NOTE — Telephone Encounter (Signed)
LCSW attempted pt for a third and final time. Multiple messages left for pt. She has been mailed assistance applications as well as community resources requested Environmental manager, [employer]). I remain available should pt be interested in engaging with care navigation team in the future.  ? ?Octavio Graves, MSW, LCSW ?Clinical Social Worker II ?[place] Heart/Vascular Care Navigation  ?[phone]- work cell phone (preferred) ?[phone]- desk phone ? ?

## 2022-07-17 DIAGNOSIS — Z419 Encounter for procedure for purposes other than remedying health state, unspecified: Secondary | ICD-10-CM | POA: Diagnosis not present

## 2022-08-17 DIAGNOSIS — Z419 Encounter for procedure for purposes other than remedying health state, unspecified: Secondary | ICD-10-CM | POA: Diagnosis not present

## 2022-08-22 DIAGNOSIS — N3001 Acute cystitis with hematuria: Secondary | ICD-10-CM | POA: Diagnosis not present

## 2022-09-17 DIAGNOSIS — Z419 Encounter for procedure for purposes other than remedying health state, unspecified: Secondary | ICD-10-CM | POA: Diagnosis not present

## 2022-10-09 ENCOUNTER — Telehealth: Payer: Self-pay

## 2022-10-09 NOTE — Telephone Encounter (Signed)
Mychart msg sent

## 2022-10-16 DIAGNOSIS — Z419 Encounter for procedure for purposes other than remedying health state, unspecified: Secondary | ICD-10-CM | POA: Diagnosis not present

## 2022-11-16 DIAGNOSIS — Z419 Encounter for procedure for purposes other than remedying health state, unspecified: Secondary | ICD-10-CM | POA: Diagnosis not present

## 2022-12-16 DIAGNOSIS — Z419 Encounter for procedure for purposes other than remedying health state, unspecified: Secondary | ICD-10-CM | POA: Diagnosis not present

## 2023-01-12 ENCOUNTER — Encounter (HOSPITAL_BASED_OUTPATIENT_CLINIC_OR_DEPARTMENT_OTHER): Payer: Self-pay

## 2023-01-12 ENCOUNTER — Other Ambulatory Visit (HOSPITAL_BASED_OUTPATIENT_CLINIC_OR_DEPARTMENT_OTHER): Payer: Self-pay

## 2023-01-12 ENCOUNTER — Emergency Department (HOSPITAL_BASED_OUTPATIENT_CLINIC_OR_DEPARTMENT_OTHER)
Admission: EM | Admit: 2023-01-12 | Discharge: 2023-01-12 | Disposition: A | Discharge status: Home / Self Care | Payer: Medicaid Other | Attending: Emergency Medicine | Admitting: Emergency Medicine

## 2023-01-12 ENCOUNTER — Other Ambulatory Visit: Payer: Self-pay

## 2023-01-12 DIAGNOSIS — N939 Abnormal uterine and vaginal bleeding, unspecified: Secondary | ICD-10-CM | POA: Diagnosis present

## 2023-01-12 DIAGNOSIS — N938 Other specified abnormal uterine and vaginal bleeding: Secondary | ICD-10-CM | POA: Insufficient documentation

## 2023-01-12 LAB — CBC
HCT: 36.7 % (ref 36.0–46.0)
Hemoglobin: 12.1 g/dL (ref 12.0–15.0)
MCH: 29.1 pg (ref 26.0–34.0)
MCHC: 33 g/dL (ref 30.0–36.0)
MCV: 88.2 fL (ref 80.0–100.0)
Platelets: 326 10*3/uL (ref 150–400)
RBC: 4.16 MIL/uL (ref 3.87–5.11)
RDW: 12.9 % (ref 11.5–15.5)
WBC: 6.5 10*3/uL (ref 4.0–10.5)
nRBC: 0 % (ref 0.0–0.2)

## 2023-01-12 LAB — URINALYSIS, ROUTINE W REFLEX MICROSCOPIC
Bacteria, UA: NONE SEEN
Bilirubin Urine: NEGATIVE
Glucose, UA: NEGATIVE mg/dL
Ketones, ur: NEGATIVE mg/dL
Leukocytes,Ua: NEGATIVE
Nitrite: NEGATIVE
Protein, ur: NEGATIVE mg/dL
Specific Gravity, Urine: 1.005 (ref 1.005–1.030)
pH: 6 (ref 5.0–8.0)

## 2023-01-12 LAB — COMPREHENSIVE METABOLIC PANEL
ALT: 19 U/L (ref 0–44)
AST: 18 U/L (ref 15–41)
Albumin: 4.4 g/dL (ref 3.5–5.0)
Alkaline Phosphatase: 59 U/L (ref 38–126)
Anion gap: 7 (ref 5–15)
BUN: 11 mg/dL (ref 6–20)
CO2: 27 mmol/L (ref 22–32)
Calcium: 9.3 mg/dL (ref 8.9–10.3)
Chloride: 104 mmol/L (ref 98–111)
Creatinine, Ser: 0.7 mg/dL (ref 0.44–1.00)
GFR, Estimated: >60 mL/min (ref >60)
Glucose, Bld: 92 mg/dL (ref 70–99)
Potassium: 3.9 mmol/L (ref 3.5–5.1)
Sodium: 138 mmol/L (ref 135–145)
Total Bilirubin: 0.2 mg/dL — ABNORMAL LOW (ref 0.3–1.2)
Total Protein: 7.2 g/dL (ref 6.5–8.1)

## 2023-01-12 LAB — PREGNANCY, URINE: Preg Test, Ur: NEGATIVE

## 2023-01-12 MED ORDER — NAPROXEN 375 MG PO TABS
375.0000 mg | ORAL_TABLET | Freq: Two times a day (BID) | ORAL | 0 refills | Status: DC
  Filled 2023-01-12: qty 20, 10d supply, fill #0

## 2023-01-12 MED ORDER — KETOROLAC TROMETHAMINE 30 MG/ML IJ SOLN
30.0000 mg | Freq: Once | INTRAMUSCULAR | Status: AC
  Administered 2023-01-12: 30 mg via INTRAVENOUS
  Filled 2023-01-12: qty 1

## 2023-01-12 MED ORDER — MEDROXYPROGESTERONE ACETATE 5 MG PO TABS
5.0000 mg | ORAL_TABLET | Freq: Every day | ORAL | 0 refills | Status: DC
  Filled 2023-01-12: qty 5, 5d supply, fill #0

## 2023-01-12 MED ORDER — ONDANSETRON 8 MG PO TBDP
8.0000 mg | ORAL_TABLET | Freq: Three times a day (TID) | ORAL | 0 refills | Status: DC | PRN
  Filled 2023-01-12: qty 12, 15d supply, fill #0

## 2023-01-12 NOTE — ED Triage Notes (Signed)
Patient here POV from Home.  Endorses starting her menstrual Cycle 3 Weeks ago (05/14) and since then its been consistent which is abnormal. Some Pelvic and leg cramping. No Fevers. Some nausea. No emesis or Diarrhea.   NAD noted during Triage. A&Ox4. Gcs 15. Ambulatory.

## 2023-01-12 NOTE — ED Notes (Signed)
Pt verbalized understanding of d/c instructions, meds, and followup care. Denies questions. VSS, no distress noted. Steady gait to exit with all belongings.  ?

## 2023-01-12 NOTE — ED Notes (Signed)
ED Provider at bedside. 

## 2023-01-12 NOTE — Discharge Instructions (Addendum)
Follow-up with an OB/GYN doctor for further treatment and evaluation.  Take the medications as prescribed to help with your vaginal bleeding and pain
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
## 2023-01-13 LAB — GC/CHLAMYDIA PROBE AMP (~~LOC~~) NOT AT ARMC
Chlamydia: NEGATIVE
Comment: NEGATIVE
Comment: NORMAL
Neisseria Gonorrhea: NEGATIVE

## 2023-01-16 DIAGNOSIS — Z419 Encounter for procedure for purposes other than remedying health state, unspecified: Secondary | ICD-10-CM | POA: Diagnosis not present

## 2023-02-15 DIAGNOSIS — Z419 Encounter for procedure for purposes other than remedying health state, unspecified: Secondary | ICD-10-CM | POA: Diagnosis not present

## 2023-02-28 ENCOUNTER — Emergency Department (HOSPITAL_BASED_OUTPATIENT_CLINIC_OR_DEPARTMENT_OTHER): Payer: Medicaid Other

## 2023-02-28 ENCOUNTER — Encounter (HOSPITAL_BASED_OUTPATIENT_CLINIC_OR_DEPARTMENT_OTHER): Payer: Self-pay

## 2023-02-28 ENCOUNTER — Emergency Department (HOSPITAL_BASED_OUTPATIENT_CLINIC_OR_DEPARTMENT_OTHER)
Admission: EM | Admit: 2023-02-28 | Discharge: 2023-02-28 | Disposition: A | Discharge status: Home / Self Care | Payer: Medicaid Other | Attending: Emergency Medicine | Admitting: Emergency Medicine

## 2023-02-28 ENCOUNTER — Other Ambulatory Visit: Payer: Self-pay

## 2023-02-28 DIAGNOSIS — M25512 Pain in left shoulder: Secondary | ICD-10-CM | POA: Diagnosis not present

## 2023-02-28 DIAGNOSIS — R9431 Abnormal electrocardiogram [ECG] [EKG]: Secondary | ICD-10-CM | POA: Diagnosis not present

## 2023-02-28 MED ORDER — MELOXICAM 15 MG PO TABS: 15.0000 mg | ORAL_TABLET | Freq: Every day | ORAL | 0 refills | Status: AC

## 2023-02-28 MED ORDER — METHOCARBAMOL 500 MG PO TABS: 500.0000 mg | ORAL_TABLET | Freq: Three times a day (TID) | ORAL | 0 refills | Status: AC | PRN

## 2023-02-28 MED ORDER — KETOROLAC TROMETHAMINE 30 MG/ML IJ SOLN
30.0000 mg | Freq: Once | INTRAMUSCULAR | Status: AC
  Administered 2023-02-28: 30 mg via INTRAMUSCULAR
  Filled 2023-02-28: qty 1

## 2023-02-28 MED ORDER — SENNOSIDES-DOCUSATE SODIUM 8.6-50 MG PO TABS: 1.0000 | ORAL_TABLET | Freq: Every evening | ORAL | 0 refills | Status: AC | PRN

## 2023-02-28 MED ORDER — OXYCODONE-ACETAMINOPHEN 5-325 MG PO TABS: 1.0000 | ORAL_TABLET | Freq: Four times a day (QID) | ORAL | 0 refills | Status: DC | PRN

## 2023-02-28 NOTE — Discharge Instructions (Addendum)
You were seen in the emergency room today with shoulder pain.  Your x-ray does not show any broken bones but I would like for you to follow with the orthopedic team as soon as they are able to see you. Take Percocet only as needed for severe pain. Do not drive while taking this medication. Meloxicam is similar to Motrin/Naproxen and you should not take additional medication. Please keep your shoulder moving and do not stay in the sling for Katie Malone periods as this can cause increased stiffness.

## 2023-02-28 NOTE — ED Triage Notes (Signed)
Pt arrives with c/o left shoulder pain that started a few days ago. Per pt, the pain has worsen even with OTC meds. Per pt, pain is worse with movement and radiating down her arm and across her shoulder. Pt denies CP or SOB.

## 2023-02-28 NOTE — ED Notes (Signed)
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
## 2023-03-16 ENCOUNTER — Other Ambulatory Visit (HOSPITAL_BASED_OUTPATIENT_CLINIC_OR_DEPARTMENT_OTHER): Payer: Self-pay | Admitting: Emergency Medicine

## 2023-03-16 DIAGNOSIS — M542 Cervicalgia: Secondary | ICD-10-CM | POA: Diagnosis not present

## 2023-03-16 DIAGNOSIS — M25512 Pain in left shoulder: Secondary | ICD-10-CM | POA: Diagnosis not present

## 2023-03-18 ENCOUNTER — Other Ambulatory Visit (HOSPITAL_BASED_OUTPATIENT_CLINIC_OR_DEPARTMENT_OTHER): Payer: Self-pay | Admitting: Emergency Medicine

## 2023-03-18 ENCOUNTER — Other Ambulatory Visit (HOSPITAL_BASED_OUTPATIENT_CLINIC_OR_DEPARTMENT_OTHER): Payer: Self-pay

## 2023-03-18 DIAGNOSIS — Z419 Encounter for procedure for purposes other than remedying health state, unspecified: Secondary | ICD-10-CM | POA: Diagnosis not present

## 2023-03-22 ENCOUNTER — Other Ambulatory Visit (HOSPITAL_BASED_OUTPATIENT_CLINIC_OR_DEPARTMENT_OTHER): Payer: Self-pay

## 2023-04-18 DIAGNOSIS — Z419 Encounter for procedure for purposes other than remedying health state, unspecified: Secondary | ICD-10-CM | POA: Diagnosis not present

## 2023-05-18 DIAGNOSIS — Z419 Encounter for procedure for purposes other than remedying health state, unspecified: Secondary | ICD-10-CM | POA: Diagnosis not present

## 2023-06-18 DIAGNOSIS — Z419 Encounter for procedure for purposes other than remedying health state, unspecified: Secondary | ICD-10-CM | POA: Diagnosis not present

## 2023-07-18 DIAGNOSIS — Z419 Encounter for procedure for purposes other than remedying health state, unspecified: Secondary | ICD-10-CM | POA: Diagnosis not present

## 2023-08-05 DIAGNOSIS — G43909 Migraine, unspecified, not intractable, without status migrainosus: Secondary | ICD-10-CM | POA: Diagnosis not present

## 2023-08-05 DIAGNOSIS — F419 Anxiety disorder, unspecified: Secondary | ICD-10-CM | POA: Diagnosis not present

## 2023-08-18 DIAGNOSIS — Z419 Encounter for procedure for purposes other than remedying health state, unspecified: Secondary | ICD-10-CM | POA: Diagnosis not present
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
## 2023-09-18 DIAGNOSIS — Z419 Encounter for procedure for purposes other than remedying health state, unspecified: Secondary | ICD-10-CM | POA: Diagnosis not present

## 2023-10-16 DIAGNOSIS — Z419 Encounter for procedure for purposes other than remedying health state, unspecified: Secondary | ICD-10-CM | POA: Diagnosis not present

## 2023-11-08 ENCOUNTER — Other Ambulatory Visit: Payer: Self-pay

## 2023-11-08 ENCOUNTER — Emergency Department (HOSPITAL_BASED_OUTPATIENT_CLINIC_OR_DEPARTMENT_OTHER)
Admission: EM | Admit: 2023-11-08 | Discharge: 2023-11-08 | Disposition: A | Discharge status: Home / Self Care | Attending: Emergency Medicine | Admitting: Emergency Medicine

## 2023-11-08 ENCOUNTER — Encounter (HOSPITAL_BASED_OUTPATIENT_CLINIC_OR_DEPARTMENT_OTHER): Payer: Self-pay | Admitting: *Deleted

## 2023-11-08 DIAGNOSIS — F17210 Nicotine dependence, cigarettes, uncomplicated: Secondary | ICD-10-CM | POA: Diagnosis not present

## 2023-11-08 DIAGNOSIS — G43809 Other migraine, not intractable, without status migrainosus: Secondary | ICD-10-CM | POA: Insufficient documentation

## 2023-11-08 DIAGNOSIS — R519 Headache, unspecified: Secondary | ICD-10-CM | POA: Diagnosis present

## 2023-11-08 MED ORDER — SODIUM CHLORIDE 0.9 % IV BOLUS: 1000.0000 mL | Freq: Once | INTRAVENOUS | Status: DC

## 2023-11-08 MED ORDER — DIPHENHYDRAMINE HCL 50 MG/ML IJ SOLN
25.0000 mg | Freq: Once | INTRAMUSCULAR | Status: DC
  Filled 2023-11-08: qty 1

## 2023-11-08 MED ORDER — LIDOCAINE 5 % EX PTCH
1.0000 | MEDICATED_PATCH | Freq: Once | CUTANEOUS | Status: DC
  Filled 2023-11-08: qty 1

## 2023-11-08 MED ORDER — KETOROLAC TROMETHAMINE 15 MG/ML IJ SOLN
15.0000 mg | Freq: Once | INTRAMUSCULAR | Status: AC
  Administered 2023-11-08: 15 mg via INTRAMUSCULAR
  Filled 2023-11-08: qty 1

## 2023-11-08 MED ORDER — METOCLOPRAMIDE HCL 5 MG/ML IJ SOLN
5.0000 mg | Freq: Once | INTRAMUSCULAR | Status: DC
  Filled 2023-11-08: qty 2

## 2023-11-08 MED ORDER — METOCLOPRAMIDE HCL 10 MG PO TABS
5.0000 mg | ORAL_TABLET | Freq: Once | ORAL | Status: AC
  Administered 2023-11-08: 5 mg via ORAL
  Filled 2023-11-08: qty 1

## 2023-11-08 MED ORDER — METOCLOPRAMIDE HCL 5 MG PO TABS: 5.0000 mg | ORAL_TABLET | Freq: Four times a day (QID) | ORAL | 0 refills | Status: AC | PRN

## 2023-11-08 MED ORDER — MAGNESIUM SULFATE 2 GM/50ML IV SOLN
2.0000 g | Freq: Once | INTRAVENOUS | Status: DC
  Filled 2023-11-08: qty 50

## 2023-11-08 MED ORDER — DIPHENHYDRAMINE HCL 25 MG PO CAPS
25.0000 mg | ORAL_CAPSULE | Freq: Once | ORAL | Status: AC
  Administered 2023-11-08: 25 mg via ORAL
  Filled 2023-11-08: qty 1
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
## 2023-11-08 NOTE — ED Triage Notes (Addendum)
 C/o "migraine" headache that started 3 days ago. States hx of migraines. C/o frontal and posterior headache. C/o nausea. No vomiting. States a little light sensitivity. Dizziness with bending over. Has taken bayer, tylenol within the last few hours. Has not taken topamax since yesterday.

## 2023-11-08 NOTE — Discharge Instructions (Addendum)
 It was a pleasure caring for you today in the emergency department.  Please take your home medications as prescribed.  Get plenty of rest, drink plenty fluids.  Balanced diet.  Avoid tobacco, alcohol, illicit drugs or THC.  Avoid triggers of your headache.  Follow-up with your PCP  Please return to the emergency department for any worsening or worrisome symptoms.

## 2023-11-27 DIAGNOSIS — Z419 Encounter for procedure for purposes other than remedying health state, unspecified: Secondary | ICD-10-CM | POA: Diagnosis not present

## 2023-12-10 DIAGNOSIS — R82998 Other abnormal findings in urine: Secondary | ICD-10-CM | POA: Diagnosis not present

## 2023-12-10 DIAGNOSIS — R300 Dysuria: Secondary | ICD-10-CM | POA: Diagnosis not present

## 2023-12-10 DIAGNOSIS — Z711 Person with feared health complaint in whom no diagnosis is made: Secondary | ICD-10-CM | POA: Diagnosis not present

## 2023-12-27 DIAGNOSIS — Z419 Encounter for procedure for purposes other than remedying health state, unspecified: Secondary | ICD-10-CM | POA: Diagnosis not present

## 2023-12-31 ENCOUNTER — Encounter (HOSPITAL_COMMUNITY): Payer: Self-pay

## 2023-12-31 ENCOUNTER — Ambulatory Visit (HOSPITAL_COMMUNITY)
Admission: EM | Admit: 2023-12-31 | Discharge: 2023-12-31 | Disposition: A | Discharge status: Home / Self Care | Attending: Physician Assistant | Admitting: Physician Assistant

## 2023-12-31 DIAGNOSIS — Z3202 Encounter for pregnancy test, result negative: Secondary | ICD-10-CM

## 2023-12-31 DIAGNOSIS — R102 Pelvic and perineal pain: Secondary | ICD-10-CM | POA: Insufficient documentation

## 2023-12-31 DIAGNOSIS — N730 Acute parametritis and pelvic cellulitis: Secondary | ICD-10-CM | POA: Diagnosis not present

## 2023-12-31 LAB — CBC WITH DIFFERENTIAL/PLATELET
Abs Immature Granulocytes: 0.03 10*3/uL (ref 0.00–0.07)
Basophils Absolute: 0.1 10*3/uL (ref 0.0–0.1)
Basophils Relative: 1 %
Eosinophils Absolute: 0.1 10*3/uL (ref 0.0–0.5)
Eosinophils Relative: 1 %
HCT: 37 % (ref 36.0–46.0)
Hemoglobin: 12.6 g/dL (ref 12.0–15.0)
Immature Granulocytes: 0 %
Lymphocytes Relative: 29 %
Lymphs Abs: 2.4 10*3/uL (ref 0.7–4.0)
MCH: 30.1 pg (ref 26.0–34.0)
MCHC: 34.1 g/dL (ref 30.0–36.0)
MCV: 88.5 fL (ref 80.0–100.0)
Monocytes Absolute: 0.8 10*3/uL (ref 0.1–1.0)
Monocytes Relative: 9 %
Neutro Abs: 4.9 10*3/uL (ref 1.7–7.7)
Neutrophils Relative %: 60 %
Platelets: 291 10*3/uL (ref 150–400)
RBC: 4.18 MIL/uL (ref 3.87–5.11)
RDW: 12.9 % (ref 11.5–15.5)
WBC: 8.2 10*3/uL (ref 4.0–10.5)
nRBC: 0 % (ref 0.0–0.2)

## 2023-12-31 LAB — BASIC METABOLIC PANEL WITH GFR
Anion gap: 11 (ref 5–15)
BUN: 7 mg/dL (ref 6–20)
CO2: 23 mmol/L (ref 22–32)
Calcium: 9.5 mg/dL (ref 8.9–10.3)
Chloride: 103 mmol/L (ref 98–111)
Creatinine, Ser: 0.68 mg/dL (ref 0.44–1.00)
GFR, Estimated: >60 mL/min (ref >60)
Glucose, Bld: 101 mg/dL — ABNORMAL HIGH (ref 70–99)
Potassium: 4 mmol/L (ref 3.5–5.1)
Sodium: 137 mmol/L (ref 135–145)

## 2023-12-31 LAB — POCT URINALYSIS DIP (MANUAL ENTRY)
Bilirubin, UA: NEGATIVE
Glucose, UA: NEGATIVE mg/dL
Ketones, POC UA: NEGATIVE mg/dL
Leukocytes, UA: NEGATIVE
Nitrite, UA: NEGATIVE
Protein Ur, POC: NEGATIVE mg/dL
Spec Grav, UA: 1.015 (ref 1.010–1.025)
Urobilinogen, UA: 0.2 U/dL
pH, UA: 6 (ref 5.0–8.0)

## 2023-12-31 LAB — POCT URINE PREGNANCY: Preg Test, Ur: NEGATIVE

## 2023-12-31 MED ORDER — DOXYCYCLINE HYCLATE 100 MG PO CAPS: 100.0000 mg | ORAL_CAPSULE | Freq: Two times a day (BID) | ORAL | 0 refills | Status: AC

## 2023-12-31 MED ORDER — CEFTRIAXONE SODIUM 500 MG IJ SOLR
500.0000 mg | INTRAMUSCULAR | Status: DC
  Administered 2023-12-31: 500 mg via INTRAMUSCULAR

## 2023-12-31 MED ORDER — LIDOCAINE HCL (PF) 1 % IJ SOLN
INTRAMUSCULAR | Status: AC
  Filled 2023-12-31: qty 2

## 2023-12-31 MED ORDER — METRONIDAZOLE 500 MG PO TABS: 500.0000 mg | ORAL_TABLET | Freq: Two times a day (BID) | ORAL | 0 refills | Status: AC

## 2023-12-31 MED ORDER — CEFTRIAXONE SODIUM 500 MG IJ SOLR
INTRAMUSCULAR | Status: AC
  Filled 2023-12-31: qty 500

## 2023-12-31 NOTE — Discharge Instructions (Signed)
 We are treating you for an infection of your pelvis.  We gave an injection of antibiotics today and I would like you to start metronidazole  and doxycycline twice daily for 2 weeks.  Do not drink any alcohol while on metronidazole  as it was caused you to vomit.  Stay out of the sun while on doxycycline as it will make you sensitive to the sun.  Follow-up with OB/GYN as soon as possible; call to schedule an appointment.  If you have any worsening pain, fever, abnormal discharge, nausea, vomiting you should go to the emergency room immediately.
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
## 2023-12-31 NOTE — ED Triage Notes (Signed)
 Pt states 3 weeks ago she had BV,Trich and a UTI states she completed treatment for all but is still having pelvic pain and her urine is dark.

## 2024-01-01 LAB — URINE CULTURE: Culture: NO GROWTH

## 2024-01-03 LAB — CERVICOVAGINAL ANCILLARY ONLY
Bacterial Vaginitis (gardnerella): POSITIVE — AB
Candida Glabrata: NEGATIVE
Candida Vaginitis: POSITIVE — AB
Chlamydia: NEGATIVE
Comment: NEGATIVE
Comment: NEGATIVE
Comment: NEGATIVE
Comment: NEGATIVE
Comment: NEGATIVE
Comment: NORMAL
Neisseria Gonorrhea: NEGATIVE
Trichomonas: NEGATIVE

## 2024-01-04 ENCOUNTER — Ambulatory Visit (HOSPITAL_COMMUNITY): Payer: Self-pay

## 2024-01-04 MED ORDER — FLUCONAZOLE 150 MG PO TABS: 150.0000 mg | ORAL_TABLET | Freq: Once | ORAL | 0 refills | Status: AC

## 2024-01-27 DIAGNOSIS — Z419 Encounter for procedure for purposes other than remedying health state, unspecified: Secondary | ICD-10-CM | POA: Diagnosis not present

## 2024-02-26 DIAGNOSIS — Z419 Encounter for procedure for purposes other than remedying health state, unspecified: Secondary | ICD-10-CM | POA: Diagnosis not present

## 2024-03-28 DIAGNOSIS — Z419 Encounter for procedure for purposes other than remedying health state, unspecified: Secondary | ICD-10-CM | POA: Diagnosis not present

## 2024-04-28 DIAGNOSIS — Z419 Encounter for procedure for purposes other than remedying health state, unspecified: Secondary | ICD-10-CM | POA: Diagnosis not present

## 2024-05-12 DIAGNOSIS — J019 Acute sinusitis, unspecified: Secondary | ICD-10-CM | POA: Diagnosis not present

## 2024-05-15 DIAGNOSIS — H1033 Unspecified acute conjunctivitis, bilateral: Secondary | ICD-10-CM | POA: Diagnosis not present

## 2024-07-20 ENCOUNTER — Emergency Department (HOSPITAL_BASED_OUTPATIENT_CLINIC_OR_DEPARTMENT_OTHER)

## 2024-07-20 ENCOUNTER — Other Ambulatory Visit: Payer: Self-pay

## 2024-07-20 ENCOUNTER — Emergency Department (HOSPITAL_BASED_OUTPATIENT_CLINIC_OR_DEPARTMENT_OTHER)
Admission: EM | Admit: 2024-07-20 | Discharge: 2024-07-20 | Disposition: A | Discharge status: Home / Self Care | Attending: Emergency Medicine | Admitting: Emergency Medicine

## 2024-07-20 ENCOUNTER — Encounter (HOSPITAL_BASED_OUTPATIENT_CLINIC_OR_DEPARTMENT_OTHER): Payer: Self-pay | Admitting: Emergency Medicine

## 2024-07-20 DIAGNOSIS — F419 Anxiety disorder, unspecified: Secondary | ICD-10-CM | POA: Diagnosis not present

## 2024-07-20 DIAGNOSIS — U071 COVID-19: Secondary | ICD-10-CM | POA: Diagnosis not present

## 2024-07-20 DIAGNOSIS — R519 Headache, unspecified: Secondary | ICD-10-CM

## 2024-07-20 LAB — URINALYSIS, ROUTINE W REFLEX MICROSCOPIC
Glucose, UA: NEGATIVE mg/dL
Ketones, ur: 80 mg/dL — AB
Leukocytes,Ua: NEGATIVE
Nitrite: NEGATIVE
Protein, ur: NEGATIVE mg/dL
Specific Gravity, Urine: 1.02 (ref 1.005–1.030)
pH: 5.5 (ref 5.0–8.0)

## 2024-07-20 LAB — COMPREHENSIVE METABOLIC PANEL WITH GFR
ALT: 11 U/L (ref 0–44)
AST: 17 U/L (ref 15–41)
Albumin: 4.3 g/dL (ref 3.5–5.0)
Alkaline Phosphatase: 62 U/L (ref 38–126)
Anion gap: 14 (ref 5–15)
BUN: 6 mg/dL (ref 6–20)
CO2: 22 mmol/L (ref 22–32)
Calcium: 9.2 mg/dL (ref 8.9–10.3)
Chloride: 100 mmol/L (ref 98–111)
Creatinine, Ser: 0.78 mg/dL (ref 0.44–1.00)
GFR, Estimated: >60 mL/min (ref >60)
Glucose, Bld: 84 mg/dL (ref 70–99)
Potassium: 3.8 mmol/L (ref 3.5–5.1)
Sodium: 136 mmol/L (ref 135–145)
Total Bilirubin: 0.4 mg/dL (ref 0.0–1.2)
Total Protein: 7.3 g/dL (ref 6.5–8.1)

## 2024-07-20 LAB — CBC
HCT: 35.5 % — ABNORMAL LOW (ref 36.0–46.0)
Hemoglobin: 12.1 g/dL (ref 12.0–15.0)
MCH: 29.7 pg (ref 26.0–34.0)
MCHC: 34.1 g/dL (ref 30.0–36.0)
MCV: 87 fL (ref 80.0–100.0)
Platelets: 273 K/uL (ref 150–400)
RBC: 4.08 MIL/uL (ref 3.87–5.11)
RDW: 12.8 % (ref 11.5–15.5)
WBC: 8.5 K/uL (ref 4.0–10.5)
nRBC: 0 % (ref 0.0–0.2)

## 2024-07-20 LAB — URINALYSIS, MICROSCOPIC (REFLEX)

## 2024-07-20 LAB — PREGNANCY, URINE: Preg Test, Ur: NEGATIVE

## 2024-07-20 LAB — RESP PANEL BY RT-PCR (RSV, FLU A&B, COVID)  RVPGX2
Influenza A by PCR: NEGATIVE
Influenza B by PCR: NEGATIVE
Resp Syncytial Virus by PCR: NEGATIVE
SARS Coronavirus 2 by RT PCR: POSITIVE — AB

## 2024-07-20 LAB — LIPASE, BLOOD: Lipase: 19 U/L (ref 11–51)

## 2024-07-20 MED ORDER — SODIUM CHLORIDE 0.9 % IV BOLUS
1000.0000 mL | Freq: Once | INTRAVENOUS | Status: AC
  Administered 2024-07-20: 1000 mL via INTRAVENOUS

## 2024-07-20 MED ORDER — METOCLOPRAMIDE HCL 5 MG/ML IJ SOLN
10.0000 mg | Freq: Once | INTRAMUSCULAR | Status: AC
  Administered 2024-07-20: 10 mg via INTRAVENOUS
  Filled 2024-07-20: qty 2

## 2024-07-20 MED ORDER — ONDANSETRON 4 MG PO TBDP: 4.0000 mg | ORAL_TABLET | Freq: Three times a day (TID) | ORAL | 0 refills | Status: AC | PRN

## 2024-07-20 MED ORDER — BENZONATATE 100 MG PO CAPS: 100.0000 mg | ORAL_CAPSULE | Freq: Three times a day (TID) | ORAL | 0 refills | Status: AC

## 2024-07-20 MED ORDER — KETOROLAC TROMETHAMINE 15 MG/ML IJ SOLN
15.0000 mg | Freq: Once | INTRAMUSCULAR | Status: AC
  Administered 2024-07-20: 15 mg via INTRAVENOUS
  Filled 2024-07-20: qty 1

## 2024-07-20 MED ORDER — DIPHENHYDRAMINE HCL 50 MG/ML IJ SOLN
25.0000 mg | Freq: Once | INTRAMUSCULAR | Status: AC
  Administered 2024-07-20: 25 mg via INTRAVENOUS
  Filled 2024-07-20: qty 1

## 2024-07-20 NOTE — ED Provider Notes (Signed)
 [place] [facility] Provider Note   CSN: [id number] Arrival date & time: 07/20/24  1347     Patient presents with: No chief complaint on file.   Katie Malone is a 47 y.o. female.   Patient presents to the emergency department today for multiple reasons.  She reports having history of migraine headaches.  Patient developed a migraine headache which has not really proved with over-the-counter medications about 5 days ago.  She started having nausea and vomiting during this time.  She states that she has not been able to keep down much to eat or drink.  No focal abdominal pain.  She also reports cough and bodyaches.  She reports that 2 days ago she had a run out of her house quickly without a coat.  Since that time she has had a cough.  She has also had some muscle spasms in her right shoulder for which she is taking a muscle relaxer, 2 doses today, most recent 1 just prior to arrival.  No ear pain.  She has had some nasal congestion and a sore throat.  Patient also reports being under a lot of stress.  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  She denies active suicidality but did make a comment to her friend that her headache is so bad she wishes she would go to sleep and not wake up.  Patient states that her friend urged her to talk with someone.  Patient denies saying this is a desire to harm herself.       Prior to Admission medications   Medication Sig Start Date End Date Taking? Authorizing Provider  doxycycline  (VIBRAMYCIN ) 100 MG capsule Take 1 capsule (100 mg total) by mouth 2 (two) times daily. 12/31/23   Raspet, Erin K, PA-C  methocarbamol  (ROBAXIN ) 500 MG tablet Take 1 tablet (500 mg total) by mouth every 8 (eight) hours as needed for muscle spasms. 02/28/23   Long, Fonda MATSU, MD  metoCLOPramide  (REGLAN ) 5 MG tablet Take 1 tablet (5 mg total) by  mouth every 6 (six) hours as needed (headache). 11/08/23   Elnor Jayson LABOR, DO  metroNIDAZOLE  (FLAGYL ) 500 MG tablet Take 1 tablet (500 mg total) by mouth 2 (two) times daily. 12/31/23   Raspet, Erin K, PA-C  ondansetron  (ZOFRAN -ODT) 8 MG disintegrating tablet Take 1 tablet (8 mg total) by mouth every 8 (eight) hours as needed for nausea or vomiting. 01/12/23   Randol Simmonds, MD  topiramate  (TOPAMAX ) 50 MG tablet Take 1/2 tablet twice a day for 1 week, then increase to 1 tablet twice a day 09/26/21   Georjean Darice HERO, MD    Allergies: Shrimp [shellfish allergy]    Review of Systems  Updated Vital Signs BP 117/83 (BP Location: Right Arm)   Pulse (!) 116   Temp 99.6 F (37.6 C) (Oral)   Resp 18   Ht 5' 2 (1.575 m)   Wt 66.2 kg   LMP 07/17/2024 (Approximate)   SpO2 99%   BMI 26.70 kg/m   Physical Exam Vitals and nursing note reviewed.  Constitutional:      General: She is not in acute distress.    Appearance: She is well-developed.  HENT:     Head: Normocephalic and atraumatic.     Jaw: No trismus.     Right Ear: Tympanic membrane, ear canal and external ear  normal.     Left Ear: Tympanic membrane, ear canal and external ear normal.     Nose: Congestion and rhinorrhea present. No mucosal edema.     Mouth/Throat:     Mouth: Mucous membranes are moist. Mucous membranes are not dry. No oral lesions.     Pharynx: Uvula midline. No oropharyngeal exudate, posterior oropharyngeal erythema or uvula swelling.     Tonsils: No tonsillar abscesses.  Eyes:     General:        Right eye: No discharge.        Left eye: No discharge.     Conjunctiva/sclera: Conjunctivae normal.  Cardiovascular:     Rate and Rhythm: Regular rhythm. Tachycardia present.     Heart sounds: Normal heart sounds. No murmur heard. Pulmonary:     Effort: Pulmonary effort is normal. No respiratory distress.     Breath sounds: Normal breath sounds. No wheezing, rhonchi or rales.  Abdominal:     Palpations: Abdomen is  soft.     Tenderness: There is no abdominal tenderness. There is no guarding or rebound.  Musculoskeletal:     Cervical back: Normal range of motion and neck supple.     Right lower leg: No edema.     Left lower leg: No edema.  Lymphadenopathy:     Cervical: No cervical adenopathy.  Skin:    General: Skin is warm and dry.     Findings: No rash.  Neurological:     General: No focal deficit present.     Mental Status: She is alert. Mental status is at baseline.     Motor: No weakness.  Psychiatric:        Attention and Perception: Attention normal.        Mood and Affect: Mood is anxious and depressed.        Speech: Speech normal.        Behavior: Behavior normal.        Thought Content: Thought content does not include homicidal or suicidal ideation. Thought content does not include homicidal or suicidal plan.     (all labs ordered are listed, but only abnormal results are displayed) Labs Reviewed  RESP PANEL BY RT-PCR (RSV, FLU A&B, COVID)  RVPGX2 - Abnormal; Notable for the following components:      Result Value   SARS Coronavirus 2 by RT PCR POSITIVE (*)    All other components within normal limits  CBC - Abnormal; Notable for the following components:   HCT 35.5 (*)    All other components within normal limits  URINALYSIS, ROUTINE W REFLEX MICROSCOPIC - Abnormal; Notable for the following components:   Hgb urine dipstick MODERATE (*)    Bilirubin Urine SMALL (*)    Ketones, ur 80 (*)    All other components within normal limits  URINALYSIS, MICROSCOPIC (REFLEX) - Abnormal; Notable for the following components:   Bacteria, UA RARE (*)    All other components within normal limits  LIPASE, BLOOD  COMPREHENSIVE METABOLIC PANEL WITH GFR  PREGNANCY, URINE    EKG: None  Radiology: No results found.   Procedures   Medications Ordered in the ED  sodium chloride  0.9 % bolus 1,000 mL (has no administration in time range)  metoCLOPramide  (REGLAN ) injection 10 mg (has  no administration in time range)  diphenhydrAMINE  (BENADRYL ) injection 25 mg (has no administration in time range)  ketorolac  (TORADOL ) 15 MG/ML injection 15 mg (has no administration in time range)   ED Course  Patient seen and examined. History obtained directly from patient.   Labs/EKG: In triage, CBC, CMP, lipase, UA and pregnancy were ordered for abdominal symptoms and vomiting.  Viral panel also ordered.  Imaging: Ordered chest x-ray, patient refused.  Do not feel strongly that she needs chest x-ray at this time.  Medications/Fluids: Ordered migraine cocktail with Reglan , Benadryl , Toradol .  Will also give IV fluids as she is tachycardic with recent vomiting.  Most recent vital signs reviewed and are as follows: BP 117/83 (BP Location: Right Arm)   Pulse (!) 116   Temp 99.6 F (37.6 C) (Oral)   Resp 18   Ht 5' 2 (1.575 m)   Wt 66.2 kg   LMP 07/17/2024 (Approximate)   SpO2 99%   BMI 26.70 kg/m   Initial impression: Patient with likely respiratory illness with exacerbation of underlying headaches.  She is obviously stressed and tearful on exam but denying HI/SI.  No indication for IVC at this time.  Will evaluate medically and treat symptoms.  Will consider outpatient referrals versus telepsych consultation at that time.  3:42 PM Reassessment performed. Patient appears stable, fluids running.  Labs personally reviewed and interpreted including: CBC unremarkable; CMP unremarkable; lipase normal; UA with 80 ketones, likely due to dehydration, no compelling signs of infection; pregnancy negative.  COVID test was positive.  Flu and RSV were negative.  Reviewed pertinent lab work and imaging with patient at bedside. Questions answered.   Most current vital signs reviewed and are as follows: BP 117/83 (BP Location: Right Arm)   Pulse (!) 116   Temp 99.6 F (37.6 C) (Oral)   Resp 18   Ht 5' 2 (1.575 m)   Wt 66.2 kg   LMP 07/17/2024 (Approximate)   SpO2 99%   BMI 26.70  kg/m   Plan: Will complete treatment, recheck.  4:32 PM Patient rechecked, sleeping, Will check back.   5:27 PM Reassessment performed. Patient appears improved.  She states that she is feeling better.  Still some mild pain in right shoulder, no shortness of breath.  We did discuss/offered TTS evaluation here versus outpatient referrals for her anxiety and stressors.  Again she denies suicidal ideations.  She would like outpatient referrals.  Reviewed pertinent lab work and imaging with patient at bedside. Questions answered.   Most current vital signs reviewed and are as follows: BP 104/71   Pulse (!) 108   Temp 99.6 F (37.6 C) (Oral)   Resp 16   Ht 5' 2 (1.575 m)   Wt 66.2 kg   LMP 07/17/2024 (Approximate)   SpO2 97%   BMI 26.70 kg/m   Plan: Discharge to home.   Prescriptions written for: Tessalon  for cough, Zofran  for nausea  Other home care instructions discussed: Maintain good hydration  ED return instructions discussed: Return with worsening symptoms, shortness of breath, trouble breathing, persistent vomiting.  Return with any acute mental health concerns.  Follow-up instructions discussed: Patient encouraged to follow-up with their PCP in 5 days if not improved.  Encouraged outpatient behavioral health referrals, [facility] or ED return if symptoms acutely worse.                                  Medical Decision Making Amount and/or Complexity of Data Reviewed Labs: ordered.  Risk Prescription drug management.   Patient with fever and flulike illness, found to be positive for COVID-19.  She has a headache which was  treated with migraine cocktail with improvement.  Suspect that symptoms related to COVID.  In regards to the patient's headache, critical differentials were considered including subarachnoid hemorrhage, intracerebral hemorrhage, epidural/subdural hematoma, pituitary apoplexy, vertebral/carotid artery dissection, giant cell arteritis, central venous  thrombosis, reversible cerebral vasoconstriction, acute angle closure glaucoma, idiopathic intracranial hypertension, bacterial meningitis, viral encephalitis, carbon monoxide poisoning, posterior reversible encephalopathy syndrome, pre-eclampsia.   Reg flag symptoms related to these causes were considered including systemic symptoms (fever, weight loss), neurologic symptoms (confusion, mental status change, vision change, associated seizure), acute or sudden thunderclap onset, patient age 78 or older with new or progressive headache, patient of any age with first headache or change in headache pattern, pregnant or postpartum status, history of HIV or other immunocompromise, history of cancer, headache occurring with exertion, associated neck or shoulder pain, associated traumatic injury, concurrent use of anticoagulation, family history of spontaneous SAH, and concurrent drug use.    Other benign, more common causes of headache were considered including migraine, tension-type headache, cluster headache, referred pain from other cause such as sinus infection, dental pain, trigeminal neuralgia.   On exam, patient has a reassuring neuro exam including baseline mental status, no significant neck pain or meningeal signs.   Do not feel that she requires head imaging at this time.  No focal or lateralizing symptoms.  Patient also is under a lot of stressors.  She denies suicidal or homicidal ideations but stressors obviously causing and affecting her life.  Offered TTS, patient declines, excepts outpatient referrals.  She is controllable to return if symptoms do worsen.  No indications for involuntary commitment at this time.  The patient's vital signs, pertinent lab work and imaging were reviewed and interpreted as discussed in the ED course. Hospitalization was considered for further testing, treatments, or serial exams/observation. However as patient is well-appearing, has a stable exam over the course of  their evaluation, and reassuring studies today, I do not feel that they warrant admission at this time. This plan was discussed with the patient who verbalizes agreement and comfort with this plan and seems reliable and able to return to the Emergency Department with worsening or changing symptoms.       Final diagnoses:  COVID-19  Acute nonintractable headache, unspecified headache type  Anxiety    ED Discharge Orders          Ordered    benzonatate  (TESSALON ) 100 MG capsule  Every 8 hours        07/20/24 1723    ondansetron  (ZOFRAN -ODT) 4 MG disintegrating tablet  Every 8 hours PRN        07/20/24 1723               Desiderio Chew, PA-C 07/20/24 1731    Elnor Jayson LABOR, DO 07/25/24 [phone]

## 2024-07-20 NOTE — Discharge Instructions (Addendum)
 Please read and follow all provided instructions.  Your diagnoses today include:  1. COVID-19   2. Acute nonintractable headache, unspecified headache type   3. Anxiety     Tests performed today include: Vital signs. See below for your results today.  COVID test - was positive Complete blood cell count: No concerns Complete metabolic panel: Was normal Lipase (pancreas function test): Was normal Urinalysis (urine test): Dehydration, no concern for UTI Pregnancy test (urine or blood, in women only):  Medications prescribed:  Tessalon Perles - cough suppressant medication  Zofran  (ondansetron ) - for nausea and vomiting  Please use over-the-counter NSAID medications (ibuprofen , naproxen ) or Tylenol  (acetaminophen ) as directed on the packaging for pain -- as long as you do not have any reasons avoid these medications. Reasons to avoid NSAID medications include: weak kidneys, a history of bleeding in your stomach or gut, or uncontrolled high blood pressure or previous heart attack. Reasons to avoid Tylenol  include: liver problems or ongoing alcohol use. Never take more than 4000mg  or 8 Extra strength Tylenol  in a 24 hour period.     Take any prescribed medications only as directed. Treatment for your infection is aimed at treating the symptoms. There are no medications, such as antibiotics, that will cure your infection.   Home care instructions:  Follow any educational materials contained in this packet.   Your illness is contagious and can be spread to others, especially during the first 3 or 4 days. It cannot be cured by antibiotics or other medicines. Take basic precautions such as washing your hands often, covering your mouth when you cough or sneeze, and avoiding public places where you could spread your illness to others.   Please continue drinking plenty of fluids.  Use over-the-counter medicines as needed as directed on packaging for symptom relief.  You may also use ibuprofen  or  tylenol  as directed on packaging for pain or fever.  Do not take multiple medicines containing Tylenol  or acetaminophen  to avoid taking too much of this medication.  If you are positive for Covid-19, you should isolate yourself and not be exposed to other people for 5 days after your symptoms began. If you are not feeling better at day 5, you need to isolate yourself for a total of 10 days. If you are feeling better by day 5, you should wear a mask properly, over your nose and mouth, at all times while around other people until 10 days after your symptoms started.   Follow-up instructions: Please follow-up with your primary care provider as needed for further evaluation of your symptoms if you are not feeling better.   Please follow-up with behavioral health referrals for outpatient treatment, consider going to behavioral health urgent care if you have acute symptoms which you feel you need to be seen for.  Return instructions:  Please return to the Emergency Department if you experience worsening symptoms.  Return to the emergency department if you have worsening shortness of breath breathing or increased work of breathing, persistent vomiting RETURN IMMEDIATELY IF you develop shortness of breath, confusion or altered mental status, a new rash, become dizzy, faint, or poorly responsive, or are unable to be cared for at home. Please return if you have persistent vomiting and cannot keep down fluids or develop a fever that is not controlled by tylenol  or motrin .   Please return if you have any other emergent concerns.  Additional Information:  Your vital signs today were: BP 104/71   Pulse (!) 108  Temp 99.6 F (37.6 C) (Oral)   Resp 16   Ht 5' 2 (1.575 m)   Wt 66.2 kg   LMP 07/17/2024 (Approximate)   SpO2 97%   BMI 26.70 kg/m  If your blood pressure (BP) was elevated above 135/85 this visit, please have this repeated by your doctor within one month. --------------

## 2024-07-20 NOTE — ED Triage Notes (Signed)
 Pt reports fever x 3 days. + cough. Also reports headache, emesis x 5 days.   Pt tearful in triage. Requesting to talk to somebody, Im just stressed. Denies SI/HI/AVH.  Took 2 muscle relaxers today appx 20 min pta.

## 2024-07-28 DIAGNOSIS — Z419 Encounter for procedure for purposes other than remedying health state, unspecified: Secondary | ICD-10-CM | POA: Diagnosis not present
